# Patient Record
Sex: Male | Born: 1965 | Race: White | Hispanic: No | Marital: Married | Smoking: Never smoker
Health system: Southern US, Community
[De-identification: ages and names within clinical notes are randomized; demographics above are authoritative.]

## PROBLEM LIST (undated history)

## (undated) DIAGNOSIS — L4050 Arthropathic psoriasis, unspecified: Secondary | ICD-10-CM

## (undated) HISTORY — DX: Arthropathic psoriasis, unspecified: L40.50

## (undated) HISTORY — PX: KNEE ARTHROPLASTY: SHX992

---

## 2002-09-13 ENCOUNTER — Emergency Department (HOSPITAL_COMMUNITY): Admission: EM | Admit: 2002-09-13 | Discharge: 2002-09-13 | Payer: Self-pay | Admitting: Emergency Medicine

## 2010-07-06 ENCOUNTER — Emergency Department (HOSPITAL_COMMUNITY): Admission: EM | Admit: 2010-07-06 | Discharge: 2010-03-06 | Payer: Self-pay | Admitting: Emergency Medicine

## 2014-01-17 ENCOUNTER — Other Ambulatory Visit: Payer: Self-pay | Admitting: Orthopedic Surgery

## 2014-01-17 DIAGNOSIS — M25561 Pain in right knee: Secondary | ICD-10-CM

## 2014-01-19 ENCOUNTER — Ambulatory Visit
Admission: RE | Admit: 2014-01-19 | Discharge: 2014-01-19 | Disposition: A | Discharge status: Home / Self Care | Payer: BC Managed Care – PPO | Source: Ambulatory Visit | Attending: Orthopedic Surgery | Admitting: Orthopedic Surgery

## 2014-01-19 DIAGNOSIS — M25561 Pain in right knee: Secondary | ICD-10-CM

## 2015-11-08 DIAGNOSIS — M19041 Primary osteoarthritis, right hand: Secondary | ICD-10-CM | POA: Diagnosis not present

## 2015-11-08 DIAGNOSIS — Z09 Encounter for follow-up examination after completed treatment for conditions other than malignant neoplasm: Secondary | ICD-10-CM | POA: Diagnosis not present

## 2015-11-08 DIAGNOSIS — M79671 Pain in right foot: Secondary | ICD-10-CM | POA: Diagnosis not present

## 2015-11-08 DIAGNOSIS — L4050 Arthropathic psoriasis, unspecified: Secondary | ICD-10-CM | POA: Diagnosis not present

## 2015-11-21 DIAGNOSIS — L4052 Psoriatic arthritis mutilans: Secondary | ICD-10-CM | POA: Diagnosis not present

## 2015-11-28 DIAGNOSIS — Z79899 Other long term (current) drug therapy: Secondary | ICD-10-CM | POA: Diagnosis not present

## 2015-12-19 DIAGNOSIS — M79641 Pain in right hand: Secondary | ICD-10-CM | POA: Diagnosis not present

## 2015-12-19 DIAGNOSIS — Z09 Encounter for follow-up examination after completed treatment for conditions other than malignant neoplasm: Secondary | ICD-10-CM | POA: Diagnosis not present

## 2015-12-19 DIAGNOSIS — M19271 Secondary osteoarthritis, right ankle and foot: Secondary | ICD-10-CM | POA: Diagnosis not present

## 2015-12-19 DIAGNOSIS — L408 Other psoriasis: Secondary | ICD-10-CM | POA: Diagnosis not present

## 2016-01-18 DIAGNOSIS — L723 Sebaceous cyst: Secondary | ICD-10-CM | POA: Diagnosis not present

## 2016-01-18 DIAGNOSIS — D2272 Melanocytic nevi of left lower limb, including hip: Secondary | ICD-10-CM | POA: Diagnosis not present

## 2016-01-18 DIAGNOSIS — D2271 Melanocytic nevi of right lower limb, including hip: Secondary | ICD-10-CM | POA: Diagnosis not present

## 2016-01-18 DIAGNOSIS — B353 Tinea pedis: Secondary | ICD-10-CM | POA: Diagnosis not present

## 2016-01-18 DIAGNOSIS — Z86018 Personal history of other benign neoplasm: Secondary | ICD-10-CM | POA: Diagnosis not present

## 2016-01-18 DIAGNOSIS — D224 Melanocytic nevi of scalp and neck: Secondary | ICD-10-CM | POA: Diagnosis not present

## 2016-01-18 DIAGNOSIS — D225 Melanocytic nevi of trunk: Secondary | ICD-10-CM | POA: Diagnosis not present

## 2016-01-18 DIAGNOSIS — L739 Follicular disorder, unspecified: Secondary | ICD-10-CM | POA: Diagnosis not present

## 2016-01-18 DIAGNOSIS — D485 Neoplasm of uncertain behavior of skin: Secondary | ICD-10-CM | POA: Diagnosis not present

## 2016-03-06 DIAGNOSIS — E559 Vitamin D deficiency, unspecified: Secondary | ICD-10-CM | POA: Diagnosis not present

## 2016-03-06 DIAGNOSIS — L4050 Arthropathic psoriasis, unspecified: Secondary | ICD-10-CM | POA: Diagnosis not present

## 2016-03-06 DIAGNOSIS — L409 Psoriasis, unspecified: Secondary | ICD-10-CM | POA: Diagnosis not present

## 2016-03-06 DIAGNOSIS — R5383 Other fatigue: Secondary | ICD-10-CM | POA: Diagnosis not present

## 2016-03-19 DIAGNOSIS — Z79899 Other long term (current) drug therapy: Secondary | ICD-10-CM | POA: Diagnosis not present

## 2016-03-27 DIAGNOSIS — Z09 Encounter for follow-up examination after completed treatment for conditions other than malignant neoplasm: Secondary | ICD-10-CM | POA: Diagnosis not present

## 2016-03-27 DIAGNOSIS — L408 Other psoriasis: Secondary | ICD-10-CM | POA: Diagnosis not present

## 2016-04-10 DIAGNOSIS — D485 Neoplasm of uncertain behavior of skin: Secondary | ICD-10-CM | POA: Diagnosis not present

## 2016-07-19 DIAGNOSIS — D2271 Melanocytic nevi of right lower limb, including hip: Secondary | ICD-10-CM | POA: Diagnosis not present

## 2016-07-19 DIAGNOSIS — D485 Neoplasm of uncertain behavior of skin: Secondary | ICD-10-CM | POA: Diagnosis not present

## 2016-07-19 DIAGNOSIS — D2272 Melanocytic nevi of left lower limb, including hip: Secondary | ICD-10-CM | POA: Diagnosis not present

## 2016-07-19 DIAGNOSIS — D2221 Melanocytic nevi of right ear and external auricular canal: Secondary | ICD-10-CM | POA: Diagnosis not present

## 2016-07-19 DIAGNOSIS — D225 Melanocytic nevi of trunk: Secondary | ICD-10-CM | POA: Diagnosis not present

## 2016-07-26 ENCOUNTER — Other Ambulatory Visit: Payer: Self-pay | Admitting: *Deleted

## 2016-07-26 NOTE — Telephone Encounter (Signed)
Refill request received via fax for Cosentyx.  Last Visit: 03/27/16 Next Visit due January 2018. Message sent to the front to schedule patient.  Labs: 03/19/16 WNL TB Gold: 06/29/15 Neg  Left message for patient to contact the office. Patient is due for labs.

## 2016-07-27 ENCOUNTER — Telehealth: Payer: Self-pay | Admitting: Rheumatology

## 2016-07-27 NOTE — Telephone Encounter (Signed)
-----   Message from Carole Binning, LPN sent at [phone]  4:34 PM EST ----- Regarding: Please schedule patient for follow up visit Please schedule patient for follow up visit. Patient due January 2018. Thanks!

## 2016-07-27 NOTE — Telephone Encounter (Signed)
LMOM for patient to call back to schedule appointment.  

## 2016-08-03 ENCOUNTER — Other Ambulatory Visit: Payer: Self-pay | Admitting: *Deleted

## 2016-08-03 DIAGNOSIS — Z79899 Other long term (current) drug therapy: Secondary | ICD-10-CM

## 2016-08-03 MED ORDER — METHOTREXATE 2.5 MG PO TABS: 10.0000 mg | ORAL_TABLET | ORAL | 0 refills | Status: DC

## 2016-08-03 MED ORDER — SECUKINUMAB 150 MG/ML ~~LOC~~ SOAJ: 300.0000 mg | SUBCUTANEOUS | 0 refills | Status: DC

## 2016-08-03 NOTE — Addendum Note (Signed)
Addended by: Carole Binning on: 08/03/2016 03:45 PM   Modules accepted: Orders

## 2016-08-03 NOTE — Telephone Encounter (Signed)
Patient is going to update labs on 08/06/16.  Patient states he also needs a refill on MTX.  Okay to refill Cosentyx and MTX for 30 days?

## 2016-08-06 ENCOUNTER — Other Ambulatory Visit: Payer: Self-pay | Admitting: *Deleted

## 2016-08-06 DIAGNOSIS — Z79899 Other long term (current) drug therapy: Secondary | ICD-10-CM

## 2016-08-07 ENCOUNTER — Other Ambulatory Visit: Payer: Self-pay | Admitting: *Deleted

## 2016-08-07 DIAGNOSIS — Z79899 Other long term (current) drug therapy: Secondary | ICD-10-CM

## 2016-08-08 LAB — COMPLETE METABOLIC PANEL WITH GFR
ALT: 22 U/L (ref 9–46)
AST: 19 U/L (ref 10–35)
Albumin: 4.5 g/dL (ref 3.6–5.1)
Alkaline Phosphatase: 48 U/L (ref 40–115)
BUN: 18 mg/dL (ref 7–25)
CO2: 27 mmol/L (ref 20–31)
Calcium: 9.8 mg/dL (ref 8.6–10.3)
Chloride: 103 mmol/L (ref 98–110)
Creat: 1.14 mg/dL (ref 0.70–1.33)
GFR, EST NON AFRICAN AMERICAN: 75 mL/min (ref >=60)
GFR, Est African American: 86 mL/min (ref >=60)
GLUCOSE: 95 mg/dL (ref 65–99)
POTASSIUM: 4.3 mmol/L (ref 3.5–5.3)
SODIUM: 141 mmol/L (ref 135–146)
TOTAL PROTEIN: 7.6 g/dL (ref 6.1–8.1)
Total Bilirubin: 0.8 mg/dL (ref 0.2–1.2)

## 2016-08-08 LAB — CBC WITH DIFFERENTIAL/PLATELET
BASOS ABS: 0 {cells}/uL (ref 0–200)
Basophils Relative: 0 %
EOS PCT: 1 %
Eosinophils Absolute: 45 cells/uL (ref 15–500)
HCT: 46.3 % (ref 38.5–50.0)
Hemoglobin: 15.3 g/dL (ref 13.2–17.1)
Lymphocytes Relative: 38 %
Lymphs Abs: 1710 cells/uL (ref 850–3900)
MCH: 28.7 pg (ref 27.0–33.0)
MCHC: 33 g/dL (ref 32.0–36.0)
MCV: 86.7 fL (ref 80.0–100.0)
MONOS PCT: 8 %
MPV: 9.6 fL (ref 7.5–12.5)
Monocytes Absolute: 360 cells/uL (ref 200–950)
NEUTROS ABS: 2385 {cells}/uL (ref 1500–7800)
Neutrophils Relative %: 53 %
PLATELETS: 282 10*3/uL (ref 140–400)
RBC: 5.34 MIL/uL (ref 4.20–5.80)
RDW: 13.5 % (ref 11.0–15.0)
WBC: 4.5 10*3/uL (ref 3.8–10.8)

## 2016-08-08 NOTE — Progress Notes (Signed)
Labs normal.

## 2016-08-10 DIAGNOSIS — J101 Influenza due to other identified influenza virus with other respiratory manifestations: Secondary | ICD-10-CM | POA: Diagnosis not present

## 2016-08-27 ENCOUNTER — Other Ambulatory Visit: Payer: Self-pay | Admitting: Rheumatology

## 2016-08-28 ENCOUNTER — Other Ambulatory Visit: Payer: Self-pay | Admitting: Rheumatology

## 2016-08-28 ENCOUNTER — Other Ambulatory Visit: Payer: Self-pay | Admitting: Radiology

## 2016-08-28 MED ORDER — FOLIC ACID 1 MG PO TABS: 2.0000 mg | ORAL_TABLET | Freq: Every day | ORAL | 4 refills | Status: DC

## 2016-08-28 MED ORDER — METHOTREXATE 2.5 MG PO TABS: 10.0000 mg | ORAL_TABLET | ORAL | 0 refills | Status: DC

## 2016-08-28 NOTE — Telephone Encounter (Signed)
I have called patient/ labs WNL 08/07/16 He needs a follow up appt. I have sent a message for appt to be made. He states he did just come in, but I do not see an appt in his chart, he did have labs.   Ok to refill per Dr Estanislado Pandy  One month supply

## 2016-08-28 NOTE — Telephone Encounter (Signed)
Thank you, I did this already this am.

## 2016-08-28 NOTE — Telephone Encounter (Signed)
Patient is requesting refill of folic acid to be sent to [facility] in [place]. He has scheduled appointment on 10/01/16.

## 2016-08-30 ENCOUNTER — Other Ambulatory Visit: Payer: Self-pay | Admitting: *Deleted

## 2016-08-30 MED ORDER — SHARPS CONTAINER MISC: 1.0000 | 0 refills | Status: DC | PRN

## 2016-08-30 NOTE — Telephone Encounter (Signed)
Last Visit: 03/27/16 Next Visit: 10/01/16  Okay to refill [employer]?

## 2016-09-05 ENCOUNTER — Other Ambulatory Visit: Payer: Self-pay | Admitting: Rheumatology

## 2016-09-06 ENCOUNTER — Other Ambulatory Visit: Payer: Self-pay | Admitting: *Deleted

## 2016-09-06 DIAGNOSIS — Z9225 Personal history of immunosupression therapy: Secondary | ICD-10-CM

## 2016-09-06 NOTE — Telephone Encounter (Signed)
Patient coming in Monday to update TB Gold

## 2016-09-06 NOTE — Telephone Encounter (Signed)
Last Visit: 03/27/16 Next Visit: 10/01/16 Labs: 08/07/16 WNL TB Gold: 05/2015 Neg   Okay to refill Cosentyx?

## 2016-09-06 NOTE — Telephone Encounter (Signed)
Is pt due for repeat tb gold since last one you wrote down is nov 2016?

## 2016-09-11 ENCOUNTER — Other Ambulatory Visit: Payer: Self-pay | Admitting: *Deleted

## 2016-09-11 DIAGNOSIS — Z9225 Personal history of immunosupression therapy: Secondary | ICD-10-CM | POA: Diagnosis not present

## 2016-09-13 LAB — QUANTIFERON TB GOLD ASSAY (BLOOD)
Interferon Gamma Release Assay: NEGATIVE
Mitogen-Nil: >10 IU/mL
Quantiferon Nil Value: 0.03 IU/mL
Quantiferon Tb Ag Minus Nil Value: 0.05 IU/mL

## 2016-09-13 NOTE — Progress Notes (Signed)
Please tell patient his TB gold is negative

## 2016-09-18 DIAGNOSIS — L988 Other specified disorders of the skin and subcutaneous tissue: Secondary | ICD-10-CM | POA: Diagnosis not present

## 2016-09-18 DIAGNOSIS — D485 Neoplasm of uncertain behavior of skin: Secondary | ICD-10-CM | POA: Diagnosis not present

## 2016-09-26 ENCOUNTER — Telehealth: Payer: Self-pay | Admitting: Rheumatology

## 2016-09-26 NOTE — Telephone Encounter (Signed)
Kyle from [facility] called about cosentyx for patient. Please call them back at [phone] opt [phone]

## 2016-09-26 NOTE — Progress Notes (Signed)
Office Visit Note  Patient: Mitchell Morris             Date of Birth: [birth year]           MRN: [id number]             PCP: Tawanna Solo, MD Referring: No ref. provider found Visit Date: 10/01/2016 Occupation: '@GUAROCC' @    Subjective:  Follow-up Psoriasis and psoriatic arthritis and high risk prescription  History of Present Illness: Mitchell Morris is a 51 y.o. male  Last seen 03/27/2016  Patient states he is doing really well with all of his joints as it relates to psoriatic arthritis. He is using medications as prescribed. He is on Cosentyx every 4 weeks Methotrexate 4 pills per week Folic acid 1 mg daily.  He is also complaining with his labs. CBC with differential and CMP with GFR normal as of June 2018 TB goal is negative as of Feb 2018.  He has a history of pre-melanoma lesions. He sees Dr. Delman Cheadle, dermatologist, for this. He had a lesion from the right scalp removed approximately 04/10/2017 and 1 from his left back about 2 weeks ago. Pathology report is pending. For the left back lesion.  His main complaint today is he's been having left back pain that got worse since Saturday. He has a ongoing problem with his back which is causing pain to radiate from the back down to his left inguinal area. It has been numb and tingling off and on for few months. Unfortunately, he started having left scrotal numbness and pain. He rates this pain and discomfort as 5-6 on a scale of 0-10. In the past he rated his pain as 2-3 on a scale of 0-10. He went to an urgent care evaluation and treatment. They diagnosed him with epididymitis. They prescribed him antibiotic but patient has not had it filled yet. He was thinking that they would give him some meloxicam but that has not been called them yet. He did take Advil 200 mg, 4 pills every 8 hours for pain control. I advised him that he can continue taking this medication at that frequency for sure could've time until he  can see his PCP. No fever, no injuries except listed couple of heavy boxes at work which may have exacerbated his pain No urinary symptoms  Patient does report that he has a business trip to go to in [facility]. It requires him to sit on the plane but he's afraid that the current problem will not allow him to sit comfortably. He states "I was very uncomfortable sitting in the chair in the waiting room.   Activities of Daily Living:  Patient reports morning stiffness for 30 minutes.   Patient Reports nocturnal pain.  Difficulty dressing/grooming: Reports Difficulty climbing stairs: Reports Difficulty getting out of chair: Reports Difficulty using hands for taps, buttons, cutlery, and/or writing: Reports   Review of Systems  Constitutional: Negative for fatigue.  HENT: Negative for mouth sores and mouth dryness.   Eyes: Negative for dryness.  Respiratory: Negative for shortness of breath.   Gastrointestinal: Negative for constipation and diarrhea.  Musculoskeletal: Negative for myalgias and myalgias.  Skin: Negative for sensitivity to sunlight.  Neurological: Negative for memory loss.  Psychiatric/Behavioral: Negative for sleep disturbance.    PMFS History:  Patient Active Problem List   Diagnosis Date Noted  . Psoriatic arthritis ([place]) 09/27/2016  . Psoriasis 09/27/2016  . High risk medication use 09/27/2016  . History of osteoarthritis  09/27/2016    No past medical history on file.  No family history on file. No past surgical history on file. Social History   Social History Narrative  . No narrative on file     Objective: Vital Signs: BP (!) 157/94   Pulse 77   Resp 12   Ht 6' (1.829 m)   Wt 200 lb (90.7 kg)   BMI 27.12 kg/m    Physical Exam  Constitutional: He is oriented to person, place, and time. He appears well-developed and well-nourished.  HENT:  Head: Normocephalic and atraumatic.  Eyes: Conjunctivae and EOM are normal. Pupils are equal,  round, and reactive to light.  Neck: Normal range of motion. Neck supple.  Cardiovascular: Normal rate, regular rhythm and normal heart sounds.  Exam reveals no gallop and no friction rub.   No murmur heard. Pulmonary/Chest: Effort normal and breath sounds normal. No respiratory distress. He has no wheezes. He has no rales. He exhibits no tenderness.  Abdominal: Soft. He exhibits no distension and no mass. There is no tenderness. There is no guarding.  Musculoskeletal: Normal range of motion.  Lymphadenopathy:    He has no cervical adenopathy.  Neurological: He is alert and oriented to person, place, and time. He exhibits normal muscle tone. Coordination normal.  Skin: Skin is warm and dry. Capillary refill takes less than 2 seconds. No rash noted.  Psychiatric: He has a normal mood and affect. His behavior is normal. Judgment and thought content normal.  Nursing note and vitals reviewed.    Musculoskeletal Exam:  Full range of motion of all joints Grip strength is equal and strong bilaterally For myalgia tender points are absent  CDAI Exam: No CDAI exam completed.  No synovitis on examination  Investigation: Findings:  09/13/2016-TB Gold Negative  Right scalp lesion removed.  According to the patient it is a pre-melanoma lesion.  Dr. Delman Cheadle addressed it, who is the dermatologist.  Surgical treatment is scheduled for September 12 for more removal of the same lesion.    Orders Only on 09/11/2016  Component Date Value Ref Range Status  . Interferon Gamma Release Assay 09/11/2016 NEGATIVE  NEGATIVE Final  . Quantiferon Nil Value 09/11/2016 0.03  IU/mL Final  . Mitogen-Nil 09/11/2016 >10.00  IU/mL Final  . Quantiferon Tb Ag Minus Nil Value 09/11/2016 0.05  IU/mL Final   Comment:   The Nil tube value is used to determine if the patient has a preexisting immune response which could cause a false-positive reading on the test. In order for a test to be valid, the Nil tube must have  a value of less than or equal to 8.0 IU/mL.   The mitogen control tube is used to assure the patient has a healthy immune status and also serves as a control for correct blood handling and incubation. It is used to detect false-negative readings. The mitogen tube must have a gamma interferon value of greater than or equal to 0.5 IU/mL higher than the value of the Nil tube.   The TB antigen tube is coated with the M. tuberculosis specific antigens. For a test to be considered positive, the TB antigen tube value minus the Nil tube value must be greater than or equal to 0.35 IU/mL.   For additional information, please refer to http://education.questdiagnostics.com/faq/QFT (This link is being provided for informational/educational purposes only.)   Orders Only on 08/07/2016  Component Date Value Ref Range Status  . WBC 08/07/2016 4.5  3.8 - 10.8 K/uL Final  .  RBC 08/07/2016 5.34  4.20 - 5.80 MIL/uL Final  . Hemoglobin 08/07/2016 15.3  13.2 - 17.1 g/dL Final  . HCT 08/07/2016 46.3  38.5 - 50.0 % Final  . MCV 08/07/2016 86.7  80.0 - 100.0 fL Final  . MCH 08/07/2016 28.7  27.0 - 33.0 pg Final  . MCHC 08/07/2016 33.0  32.0 - 36.0 g/dL Final  . RDW 08/07/2016 13.5  11.0 - 15.0 % Final  . Platelets 08/07/2016 282  140 - 400 K/uL Final  . MPV 08/07/2016 9.6  7.5 - 12.5 fL Final  . Neutro Abs 08/07/2016 2385  1,500 - 7,800 cells/uL Final  . Lymphs Abs 08/07/2016 1710  850 - 3,900 cells/uL Final  . Monocytes Absolute 08/07/2016 360  200 - 950 cells/uL Final  . Eosinophils Absolute 08/07/2016 45  15 - 500 cells/uL Final  . Basophils Absolute 08/07/2016 0  0 - 200 cells/uL Final  . Neutrophils Relative % 08/07/2016 53  % Final  . Lymphocytes Relative 08/07/2016 38  % Final  . Monocytes Relative 08/07/2016 8  % Final  . Eosinophils Relative 08/07/2016 1  % Final  . Basophils Relative 08/07/2016 0  % Final  . Smear Review 08/07/2016 Criteria for review not met   Final  . Sodium 08/07/2016 141   135 - 146 mmol/L Final  . Potassium 08/07/2016 4.3  3.5 - 5.3 mmol/L Final  . Chloride 08/07/2016 103  98 - 110 mmol/L Final  . CO2 08/07/2016 27  20 - 31 mmol/L Final  . Glucose, Bld 08/07/2016 95  65 - 99 mg/dL Final  . BUN 08/07/2016 18  7 - 25 mg/dL Final  . Creat 08/07/2016 1.14  0.70 - 1.33 mg/dL Final   Comment:   For patients > or = 51 years of age: The upper reference limit for Creatinine is approximately 13% higher for people identified as African-American.     . Total Bilirubin 08/07/2016 0.8  0.2 - 1.2 mg/dL Final  . Alkaline Phosphatase 08/07/2016 48  40 - 115 U/L Final  . AST 08/07/2016 19  10 - 35 U/L Final  . ALT 08/07/2016 22  9 - 46 U/L Final  . Total Protein 08/07/2016 7.6  6.1 - 8.1 g/dL Final  . Albumin 08/07/2016 4.5  3.6 - 5.1 g/dL Final  . Calcium 08/07/2016 9.8  8.6 - 10.3 mg/dL Final  . GFR, Est African American 08/07/2016 86  >=60 mL/min Final  . GFR, Est Non African American 08/07/2016 75  >=60 mL/min Final      Imaging: No results found.  Speciality Comments: No specialty comments available.    Procedures:  No procedures performed Allergies: Patient has no known allergies.   Assessment / Plan:     Visit Diagnoses: Psoriatic arthritis ([place])  Psoriasis  High risk medication use - MTX-2.5 MG tablet (16 tablet 0refill) 4 per weekFolic Acid- 1 MG tablet (180 tablet 4refill) 1 per dayCosentyx-300 dose 151m/mL SOAJ (3 pen 0refill)eve  Acute left-sided low back pain with left-sided sciatica  History of osteoarthritis -    Plan: #1: Psoriatic arthritis. Doing well. No complaint. No joint pain stiffness and swelling.  #2: Psoriasis. No flare. Doing well.  #3: High risk prescription. On Cosentyx every 4 weeks On methotrexate 4 per week On folic acid 1 mg daily.  TB goal is negative as of 09/11/2016 CBC with differential CMP with GFR normal as a January 2018  #4: Pre-melanoma lesion. Patient had a new lesion on the left back  that was  evaluated and treated by Dr. Girtha Rm. He states that 2 weeks ago, the left back lesion was removed by Dr. Girtha Rm. This is in addition to the pre-melanoma lesion that Dr. Girtha Rm removed September 12 to the right scalp.  #5: Left back pain that's worse since this Saturday. The pain radiates from the back to the left inguinal area to the left scrotum. He had a history of left inguinal numbness and tingling for the last few months off and on. He saw urgent care on Saturday but they diagnosed with epididymitis. They gave him an antibiotic but patient has not had it filled yet. Patient will see [facility] at urgent care at noon as a walk-in patient.  #6: Labs are up-to-date and normal. CBC with differential CMP with GFR every 3 months starting April 2018 TB gold due annually starting February 2019  #7: No med refills needed at this time according to patient. He just had all of his medicines filled.  #8: Return to clinic in 5 months Orders: No orders of the defined types were placed in this encounter.  No orders of the defined types were placed in this encounter.   Face-to-face time spent with patient was 30 minutes. 50% of time was spent in counseling and coordination of care.  Follow-Up Instructions: Return in about 5 months (around 03/03/2017) for PsA, Ps, Cosentyx q 4 wks, MTX 4/wk, Folic 66m qd // pre-melanoma  // .   NEliezer Lofts PA-C  Note - This record has been created using DEditor, commissioning  Chart creation errors have been sought, but may not always  have been located. Such creation errors do not reflect on  the standard of medical care.

## 2016-09-26 NOTE — Telephone Encounter (Signed)
Advised prescription sent in on 09/06/16 should be have been for a 90 day supply. Mitchell Morris states he would fix the prescription and get the medication to the patient.

## 2016-09-27 ENCOUNTER — Other Ambulatory Visit: Payer: Self-pay | Admitting: Rheumatology

## 2016-09-27 DIAGNOSIS — L4050 Arthropathic psoriasis, unspecified: Secondary | ICD-10-CM | POA: Insufficient documentation

## 2016-09-27 DIAGNOSIS — Z79899 Other long term (current) drug therapy: Secondary | ICD-10-CM | POA: Insufficient documentation

## 2016-09-27 DIAGNOSIS — L409 Psoriasis, unspecified: Secondary | ICD-10-CM | POA: Insufficient documentation

## 2016-09-27 NOTE — Telephone Encounter (Signed)
ok 

## 2016-09-27 NOTE — Telephone Encounter (Signed)
Last Visit: 03/27/16 Next Visit: 10/01/16 Labs: 08/07/16 WNL  Okay to refill MTX?

## 2016-09-29 DIAGNOSIS — N50819 Testicular pain, unspecified: Secondary | ICD-10-CM | POA: Diagnosis not present

## 2016-09-29 DIAGNOSIS — N451 Epididymitis: Secondary | ICD-10-CM | POA: Diagnosis not present

## 2016-09-29 DIAGNOSIS — Z79899 Other long term (current) drug therapy: Secondary | ICD-10-CM | POA: Diagnosis not present

## 2016-10-01 ENCOUNTER — Ambulatory Visit (INDEPENDENT_AMBULATORY_CARE_PROVIDER_SITE_OTHER): Payer: BLUE CROSS/BLUE SHIELD | Admitting: Rheumatology

## 2016-10-01 ENCOUNTER — Encounter: Payer: Self-pay | Admitting: Rheumatology

## 2016-10-01 VITALS — BP 157/94 | HR 77 | Resp 12 | Ht 72.0 in | Wt 200.0 lb

## 2016-10-01 DIAGNOSIS — Z79899 Other long term (current) drug therapy: Secondary | ICD-10-CM

## 2016-10-01 DIAGNOSIS — L4050 Arthropathic psoriasis, unspecified: Secondary | ICD-10-CM

## 2016-10-01 DIAGNOSIS — Z8739 Personal history of other diseases of the musculoskeletal system and connective tissue: Secondary | ICD-10-CM

## 2016-10-01 DIAGNOSIS — R1030 Lower abdominal pain, unspecified: Secondary | ICD-10-CM | POA: Diagnosis not present

## 2016-10-01 DIAGNOSIS — M5442 Lumbago with sciatica, left side: Secondary | ICD-10-CM | POA: Diagnosis not present

## 2016-10-01 DIAGNOSIS — N50819 Testicular pain, unspecified: Secondary | ICD-10-CM | POA: Diagnosis not present

## 2016-10-01 DIAGNOSIS — M5489 Other dorsalgia: Secondary | ICD-10-CM | POA: Diagnosis not present

## 2016-10-01 DIAGNOSIS — L409 Psoriasis, unspecified: Secondary | ICD-10-CM

## 2016-10-03 DIAGNOSIS — M9903 Segmental and somatic dysfunction of lumbar region: Secondary | ICD-10-CM | POA: Diagnosis not present

## 2016-10-31 DIAGNOSIS — M5416 Radiculopathy, lumbar region: Secondary | ICD-10-CM | POA: Diagnosis not present

## 2016-11-02 ENCOUNTER — Other Ambulatory Visit: Payer: Self-pay | Admitting: Family Medicine

## 2016-11-02 DIAGNOSIS — M5416 Radiculopathy, lumbar region: Secondary | ICD-10-CM

## 2016-11-11 ENCOUNTER — Ambulatory Visit
Admission: RE | Admit: 2016-11-11 | Discharge: 2016-11-11 | Disposition: A | Discharge status: Home / Self Care | Payer: BLUE CROSS/BLUE SHIELD | Source: Ambulatory Visit | Attending: Family Medicine | Admitting: Family Medicine

## 2016-11-11 DIAGNOSIS — M5416 Radiculopathy, lumbar region: Secondary | ICD-10-CM

## 2016-11-11 DIAGNOSIS — M5126 Other intervertebral disc displacement, lumbar region: Secondary | ICD-10-CM | POA: Diagnosis not present

## 2016-11-21 ENCOUNTER — Telehealth: Payer: Self-pay | Admitting: Radiology

## 2016-11-21 DIAGNOSIS — Z79899 Other long term (current) drug therapy: Secondary | ICD-10-CM

## 2016-11-21 NOTE — Telephone Encounter (Signed)
Refill request received via fax for   Cosentyx [facility]

## 2016-11-21 NOTE — Telephone Encounter (Signed)
Labs due sent message to patient

## 2016-11-22 NOTE — Addendum Note (Signed)
Addended byCandice Camp on: 11/22/2016 02:01 PM   Modules accepted: Orders

## 2016-11-22 NOTE — Telephone Encounter (Signed)
I have called patient to see what his lab plan is. He states he can come in for these on Monday

## 2016-11-29 NOTE — Telephone Encounter (Signed)
Attempted to contact the patient and left message for patient to call the office.  

## 2016-12-03 ENCOUNTER — Other Ambulatory Visit: Payer: Self-pay

## 2016-12-03 ENCOUNTER — Other Ambulatory Visit: Payer: Self-pay | Admitting: Rheumatology

## 2016-12-03 DIAGNOSIS — M545 Low back pain: Secondary | ICD-10-CM | POA: Diagnosis not present

## 2016-12-03 DIAGNOSIS — M5116 Intervertebral disc disorders with radiculopathy, lumbar region: Secondary | ICD-10-CM | POA: Diagnosis not present

## 2016-12-03 DIAGNOSIS — Z79899 Other long term (current) drug therapy: Secondary | ICD-10-CM

## 2016-12-03 LAB — COMPLETE METABOLIC PANEL WITH GFR
ALBUMIN: 4.5 g/dL (ref 3.6–5.1)
ALK PHOS: 59 U/L (ref 40–115)
ALT: 24 U/L (ref 9–46)
AST: 22 U/L (ref 10–35)
BUN: 13 mg/dL (ref 7–25)
CALCIUM: 9.9 mg/dL (ref 8.6–10.3)
CO2: 27 mmol/L (ref 20–31)
Chloride: 102 mmol/L (ref 98–110)
Creat: 1.02 mg/dL (ref 0.70–1.33)
GFR, EST NON AFRICAN AMERICAN: 85 mL/min (ref >=60)
Glucose, Bld: 86 mg/dL (ref 65–99)
POTASSIUM: 3.9 mmol/L (ref 3.5–5.3)
Sodium: 141 mmol/L (ref 135–146)
Total Bilirubin: 0.6 mg/dL (ref 0.2–1.2)
Total Protein: 7.5 g/dL (ref 6.1–8.1)

## 2016-12-03 LAB — CBC WITH DIFFERENTIAL/PLATELET
BASOS ABS: 0 {cells}/uL (ref 0–200)
Basophils Relative: 0 %
Eosinophils Absolute: 64 cells/uL (ref 15–500)
Eosinophils Relative: 1 %
HCT: 44.3 % (ref 38.5–50.0)
HEMOGLOBIN: 15.2 g/dL (ref 13.2–17.1)
LYMPHS ABS: 1792 {cells}/uL (ref 850–3900)
Lymphocytes Relative: 28 %
MCH: 28.8 pg (ref 27.0–33.0)
MCHC: 34.3 g/dL (ref 32.0–36.0)
MCV: 83.9 fL (ref 80.0–100.0)
MONO ABS: 576 {cells}/uL (ref 200–950)
MPV: 9.6 fL (ref 7.5–12.5)
Monocytes Relative: 9 %
NEUTROS PCT: 62 %
Neutro Abs: 3968 cells/uL (ref 1500–7800)
Platelets: 286 10*3/uL (ref 140–400)
RBC: 5.28 MIL/uL (ref 4.20–5.80)
RDW: 13.9 % (ref 11.0–15.0)
WBC: 6.4 10*3/uL (ref 3.8–10.8)

## 2016-12-03 NOTE — Telephone Encounter (Signed)
Pharmacy calling requesting a rf Cosentyx. [phone]

## 2016-12-04 MED ORDER — SECUKINUMAB 150 MG/ML ~~LOC~~ SOAJ: 300.0000 mg | SUBCUTANEOUS | 0 refills | Status: DC

## 2016-12-04 NOTE — Telephone Encounter (Signed)
Last Visit: 10/01/16 Next Visit: 03/04/17 Labs: 12/03/16 WNL TB Gold:09/11/16 Neg   Okay to refill Cosentyx?

## 2016-12-04 NOTE — Telephone Encounter (Signed)
Labs were completed on 12/03/16. Requested for medication refill forwarded to Mr. Carlyon Shadow.

## 2016-12-04 NOTE — Progress Notes (Signed)
WNL

## 2016-12-31 DIAGNOSIS — J029 Acute pharyngitis, unspecified: Secondary | ICD-10-CM | POA: Diagnosis not present

## 2016-12-31 DIAGNOSIS — H6980 Other specified disorders of Eustachian tube, unspecified ear: Secondary | ICD-10-CM | POA: Diagnosis not present

## 2017-01-23 DIAGNOSIS — B353 Tinea pedis: Secondary | ICD-10-CM | POA: Diagnosis not present

## 2017-01-23 DIAGNOSIS — L570 Actinic keratosis: Secondary | ICD-10-CM | POA: Diagnosis not present

## 2017-01-23 DIAGNOSIS — L821 Other seborrheic keratosis: Secondary | ICD-10-CM | POA: Diagnosis not present

## 2017-01-23 DIAGNOSIS — D2271 Melanocytic nevi of right lower limb, including hip: Secondary | ICD-10-CM | POA: Diagnosis not present

## 2017-01-23 DIAGNOSIS — D2221 Melanocytic nevi of right ear and external auricular canal: Secondary | ICD-10-CM | POA: Diagnosis not present

## 2017-02-27 ENCOUNTER — Telehealth: Payer: Self-pay | Admitting: Rheumatology

## 2017-02-27 MED ORDER — SECUKINUMAB 150 MG/ML ~~LOC~~ SOAJ: 300.0000 mg | SUBCUTANEOUS | 0 refills | Status: DC

## 2017-02-27 NOTE — Telephone Encounter (Signed)
Last Visit: 10/01/16 Next Visit: 03/04/17 Labs: 12/03/16 WNL TB Gold:09/11/16 Neg   Okay to refill per Dr. Estanislado Pandy

## 2017-02-27 NOTE — Telephone Encounter (Signed)
[facility] calling for refill on Patients Cosentex. Fax# [phone]. [facility] states they have faxed 3 requests for this. Please advise.

## 2017-03-04 ENCOUNTER — Ambulatory Visit: Payer: BLUE CROSS/BLUE SHIELD | Admitting: Rheumatology

## 2017-03-12 DIAGNOSIS — M19041 Primary osteoarthritis, right hand: Secondary | ICD-10-CM | POA: Insufficient documentation

## 2017-03-12 DIAGNOSIS — M19042 Primary osteoarthritis, left hand: Secondary | ICD-10-CM | POA: Insufficient documentation

## 2017-03-12 DIAGNOSIS — M19071 Primary osteoarthritis, right ankle and foot: Secondary | ICD-10-CM | POA: Insufficient documentation

## 2017-03-12 DIAGNOSIS — Z789 Other specified health status: Secondary | ICD-10-CM | POA: Insufficient documentation

## 2017-03-12 DIAGNOSIS — Z7289 Other problems related to lifestyle: Secondary | ICD-10-CM | POA: Insufficient documentation

## 2017-03-12 DIAGNOSIS — M5136 Other intervertebral disc degeneration, lumbar region: Secondary | ICD-10-CM | POA: Insufficient documentation

## 2017-03-12 DIAGNOSIS — M19072 Primary osteoarthritis, left ankle and foot: Secondary | ICD-10-CM

## 2017-03-12 NOTE — Progress Notes (Signed)
Office Visit Note  Patient: Mitchell Morris             Date of Birth: 03-16-1966           MRN: [id number]             PCP: Kathyrn Lass, MD Referring: Kathyrn Lass, MD Visit Date: 03/19/2017 Occupation: @GUAROCC @    Subjective:  Medication Management   History of Present Illness: Mitchell Morris is a 51 y.o. male with history of psoriatic arthritis and psoriasis. He states he is doing quite well on the combination therapy currently. He missed a few doses of methotrexate couple of months ago and had a flare about a month ago. He describes he started having increased pain in his right shoulder and right elbow. He was having difficulty lifting objects and discomfort at night. He resumed methotrexate 4 tablets by mouth every week and the symptoms are gradually getting better. He still has some discomfort i right elbow. He denies any active psoriasis lesions.   Activities of Daily Living:  Patient reports morning stiffness for 0 minute.   Patient Denies nocturnal pain.  Difficulty dressing/grooming: Denies Difficulty climbing stairs: Denies Difficulty getting out of chair: Denies Difficulty using hands for taps, buttons, cutlery, and/or writing: Denies   Review of Systems  Constitutional: Positive for fatigue. Negative for night sweats and weakness ( ).  HENT: Negative for mouth sores, mouth dryness and nose dryness.   Eyes: Negative for redness and dryness.  Respiratory: Negative for shortness of breath and difficulty breathing.   Cardiovascular: Negative for chest pain, palpitations, hypertension, irregular heartbeat and swelling in legs/feet.  Gastrointestinal: Negative for constipation and diarrhea.  Endocrine: Negative for increased urination.  Musculoskeletal: Positive for arthralgias and joint pain. Negative for joint swelling, myalgias, muscle weakness, morning stiffness, muscle tenderness and myalgias.  Skin: Negative for color change, rash, hair loss,  nodules/bumps, skin tightness, ulcers and sensitivity to sunlight.  Allergic/Immunologic: Negative for susceptible to infections.  Neurological: Negative for dizziness, fainting, memory loss and night sweats.  Hematological: Negative for swollen glands.  Psychiatric/Behavioral: Negative for depressed mood and sleep disturbance. The patient is not nervous/anxious.     PMFS History:  Patient Active Problem List   Diagnosis Date Noted  . Primary osteoarthritis of both hands 03/12/2017  . Primary osteoarthritis of both feet 03/12/2017  . DDD (degenerative disc disease), lumbar 03/12/2017  . Alcohol use/ keep Methotrexate dose low  03/12/2017  . Psoriatic arthritis ([place]) 09/27/2016  . Psoriasis 09/27/2016  . High risk medication use 09/27/2016    History reviewed. No pertinent past medical history.  No family history on file. History reviewed. No pertinent surgical history. Social History   Social History Narrative  . No narrative on file     Objective: Vital Signs: BP 120/74   Pulse 74   Resp 14   Ht 6' (1.829 m)   Wt 205 lb (93 kg)   BMI 27.80 kg/m    Physical Exam  Constitutional: He is oriented to person, place, and time. He appears well-developed and well-nourished.  HENT:  Head: Normocephalic and atraumatic.  Eyes: Pupils are equal, round, and reactive to light. Conjunctivae and EOM are normal.  Neck: Normal range of motion. Neck supple.  Cardiovascular: Normal rate, regular rhythm and normal heart sounds.   Pulmonary/Chest: Effort normal and breath sounds normal.  Abdominal: Soft. Bowel sounds are normal.  Neurological: He is alert and oriented to person, place, and time.  Skin: Skin is  warm and dry. Capillary refill takes less than 2 seconds.  Psychiatric: He has a normal mood and affect. His behavior is normal.  Nursing note and vitals reviewed.    Musculoskeletal Exam: C-spine and thoracic lumbar spine good range of motion. Shoulder joints elbow joints wrist  joint MCPs PIPs DIPs with good range of motion with no synovitis. Hip joints knee joints ankles MTPs PIPs DIPs with good range of motion with no synovitis. He had mild tenderness over the right medial epicondyle area.  CDAI Exam: CDAI Homunculus Exam:   Joint Counts:  CDAI Tender Joint count: 0 CDAI Swollen Joint count: 0  Global Assessments:  Patient Global Assessment: 2 Provider Global Assessment: 2  CDAI Calculated Score: 4    Investigation: Findings:  09/13/2016 TB Gold Negative  CBC Latest Ref Rng & Units 12/03/2016 08/07/2016  WBC 3.8 - 10.8 K/uL 6.4 4.5  Hemoglobin 13.2 - 17.1 g/dL 15.2 15.3  Hematocrit 38.5 - 50.0 % 44.3 46.3  Platelets 140 - 400 K/uL 286 282   CMP Latest Ref Rng & Units 12/03/2016 08/07/2016  Glucose 65 - 99 mg/dL 86 95  BUN 7 - 25 mg/dL 13 18  Creatinine 0.70 - 1.33 mg/dL 1.02 1.14  Sodium 135 - 146 mmol/L 141 141  Potassium 3.5 - 5.3 mmol/L 3.9 4.3  Chloride 98 - 110 mmol/L 102 103  CO2 20 - 31 mmol/L 27 27  Calcium 8.6 - 10.3 mg/dL 9.9 9.8  Total Protein 6.1 - 8.1 g/dL 7.5 7.6  Total Bilirubin 0.2 - 1.2 mg/dL 0.6 0.8  Alkaline Phos 40 - 115 U/L 59 48  AST 10 - 35 U/L 22 19  ALT 9 - 46 U/L 24 22    Imaging: No results found.  Speciality Comments: No specialty comments available.    Procedures:  No procedures performed Allergies: Patient has no known allergies.   Assessment / Plan:     Visit Diagnoses: Psoriatic arthritis [facility]): He has no synovitis on examination today. He had recent flare when he came off the methotrexate. The symptoms are gradually getting better. Patient did question regarding switching to [facility] we had detailed discussion regarding that. As he is doing quite well on combination therapy currently I would prefer not to switch him to [place]. He has failed Humira and stelara in the past.  Psoriasis: He has no active lesions of psoriasis.  High risk medication use - Methotrexate 4 tablets by mouth every week, folic acid 1 mg  by mouth daily and Cosentyx 300 mg subcutaneous every month(Humira, stelara-failed) - Plan: CBC with Differential/Platelet, COMPLETE METABOLIC PANEL WITH GFR, Quantiferon tb gold assay (blood). We will continue to monitor his labs every 3 months.  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 CBC with Differential/Platelet  . COMPLETE METABOLIC PANEL WITH GFR  . Quantiferon tb gold assay (blood)   No orders of the defined types were placed in this encounter.    Follow-Up Instructions: Return in about 5 months (around 08/19/2017) for Psoriatic arthritis Ps DDD.   Bo Merino, MD  Note - This record has been created using Editor, commissioning.  Chart creation errors have been sought, but may not always  have been located. Such creation errors do not reflect on  the standard  of medical care.

## 2017-03-19 ENCOUNTER — Ambulatory Visit (INDEPENDENT_AMBULATORY_CARE_PROVIDER_SITE_OTHER): Payer: BLUE CROSS/BLUE SHIELD | Admitting: Rheumatology

## 2017-03-19 ENCOUNTER — Encounter: Payer: Self-pay | Admitting: Rheumatology

## 2017-03-19 VITALS — BP 120/74 | HR 74 | Resp 14 | Ht 72.0 in | Wt 205.0 lb

## 2017-03-19 DIAGNOSIS — L409 Psoriasis, unspecified: Secondary | ICD-10-CM | POA: Diagnosis not present

## 2017-03-19 DIAGNOSIS — Z79899 Other long term (current) drug therapy: Secondary | ICD-10-CM

## 2017-03-19 DIAGNOSIS — M19042 Primary osteoarthritis, left hand: Secondary | ICD-10-CM | POA: Diagnosis not present

## 2017-03-19 DIAGNOSIS — Z789 Other specified health status: Secondary | ICD-10-CM | POA: Diagnosis not present

## 2017-03-19 DIAGNOSIS — M19041 Primary osteoarthritis, right hand: Secondary | ICD-10-CM

## 2017-03-19 DIAGNOSIS — M19071 Primary osteoarthritis, right ankle and foot: Secondary | ICD-10-CM | POA: Diagnosis not present

## 2017-03-19 DIAGNOSIS — M5136 Other intervertebral disc degeneration, lumbar region: Secondary | ICD-10-CM

## 2017-03-19 DIAGNOSIS — L4050 Arthropathic psoriasis, unspecified: Secondary | ICD-10-CM | POA: Diagnosis not present

## 2017-03-19 DIAGNOSIS — M19072 Primary osteoarthritis, left ankle and foot: Secondary | ICD-10-CM | POA: Diagnosis not present

## 2017-03-19 DIAGNOSIS — Z7289 Other problems related to lifestyle: Secondary | ICD-10-CM

## 2017-03-19 LAB — CBC WITH DIFFERENTIAL/PLATELET
BASOS PCT: 1 %
Basophils Absolute: 41 cells/uL (ref 0–200)
Eosinophils Absolute: 41 cells/uL (ref 15–500)
Eosinophils Relative: 1 %
HCT: 44.8 % (ref 38.5–50.0)
Hemoglobin: 15.1 g/dL (ref 13.2–17.1)
LYMPHS PCT: 33 %
Lymphs Abs: 1353 cells/uL (ref 850–3900)
MCH: 28.7 pg (ref 27.0–33.0)
MCHC: 33.7 g/dL (ref 32.0–36.0)
MCV: 85.2 fL (ref 80.0–100.0)
MONOS PCT: 9 %
MPV: 9.9 fL (ref 7.5–12.5)
Monocytes Absolute: 369 cells/uL (ref 200–950)
Neutro Abs: 2296 cells/uL (ref 1500–7800)
Neutrophils Relative %: 56 %
PLATELETS: 256 10*3/uL (ref 140–400)
RBC: 5.26 MIL/uL (ref 4.20–5.80)
RDW: 13.5 % (ref 11.0–15.0)
WBC: 4.1 10*3/uL (ref 3.8–10.8)

## 2017-03-19 NOTE — Progress Notes (Signed)
Rheumatology Medication Review by a Pharmacist Does the patient feel that his/her medications are working for him/her?  Yes Has the patient been experiencing any side effects to the medications prescribed?  No Does the patient have any problems obtaining medications?  No  Issues to address at subsequent visits: None   Pharmacist comments:  Mitchell Morris is a pleasant 51 yo M who presents for follow up of psoriatic arthritis.  He is currently taking Cosentyx 300 mg every month, methotrexate 4 tablets weekly, and folic acid 2 mg daily.  He reports he forgot to take methotrexate for a couple of months.  He had no reason for stopping, but reports he just forgot.  He restarted the medication approximately four weeks ago.  Most recent standing labs were normal on 12/03/16.  Patient is due for labs today then every 3 months.  Most recent TB Gold negative on 09/11/16.  Patient will be due for TB Gold again in February 2019.  Patient denies any questions or concerns regarding his medications at this time.   Elisabeth Most, Pharm.D., BCPS, CPP Clinical Pharmacist Pager: [phone] Phone: [phone] 03/19/2017 12:03 PM

## 2017-03-19 NOTE — Patient Instructions (Signed)
Standing Labs We placed an order today for your standing lab work.    Please come back and get your standing labs in November 2018 and every 3 months.  We have open lab Monday through Friday from 8:30-11:30 AM and 1:30-4 PM at the office of Dr. Shaili Deveshwar.   The office is located at [address], [address], [place], [place] [address] No appointment is necessary.   Labs are drawn by [facility].  You may receive a bill from [facility] for your lab work. If you have any questions regarding directions or hours of operation,  please call [phone].     

## 2017-03-20 LAB — COMPLETE METABOLIC PANEL WITH GFR
ALK PHOS: 61 U/L (ref 40–115)
ALT: 39 U/L (ref 9–46)
AST: 26 U/L (ref 10–35)
Albumin: 4.5 g/dL (ref 3.6–5.1)
BUN: 13 mg/dL (ref 7–25)
CHLORIDE: 103 mmol/L (ref 98–110)
CO2: 25 mmol/L (ref 20–32)
CREATININE: 1.01 mg/dL (ref 0.70–1.33)
Calcium: 9.8 mg/dL (ref 8.6–10.3)
GFR, Est Non African American: 86 mL/min (ref >=60)
Glucose, Bld: 96 mg/dL (ref 65–99)
Potassium: 4.6 mmol/L (ref 3.5–5.3)
Sodium: 139 mmol/L (ref 135–146)
Total Bilirubin: 0.7 mg/dL (ref 0.2–1.2)
Total Protein: 7.4 g/dL (ref 6.1–8.1)

## 2017-03-20 NOTE — Progress Notes (Signed)
Within normal limits

## 2017-05-07 ENCOUNTER — Other Ambulatory Visit: Payer: Self-pay | Admitting: Rheumatology

## 2017-05-07 MED ORDER — SECUKINUMAB 150 MG/ML ~~LOC~~ SOAJ: 300.0000 mg | SUBCUTANEOUS | 0 refills | Status: DC

## 2017-05-07 NOTE — Telephone Encounter (Signed)
Last visit: 03/19/17 Next visit: 08/22/17 Labs: WNL 03/19/17  Tb gold: Negative 09/11/2016  Received refill request via fax from [facility].  Ok to refill cosentyx per Dr. Estanislado Pandy.

## 2017-05-24 ENCOUNTER — Other Ambulatory Visit: Payer: Self-pay | Admitting: Rheumatology

## 2017-05-27 NOTE — Telephone Encounter (Signed)
Last visit: 03/19/17 Next visit: 08/22/17 Labs: WNL 03/19/17   Okay to refill per Dr. Estanislado Pandy

## 2017-07-31 ENCOUNTER — Other Ambulatory Visit: Payer: Self-pay | Admitting: Rheumatology

## 2017-07-31 DIAGNOSIS — A630 Anogenital (venereal) warts: Secondary | ICD-10-CM | POA: Diagnosis not present

## 2017-07-31 DIAGNOSIS — L570 Actinic keratosis: Secondary | ICD-10-CM | POA: Diagnosis not present

## 2017-07-31 DIAGNOSIS — L309 Dermatitis, unspecified: Secondary | ICD-10-CM | POA: Diagnosis not present

## 2017-07-31 DIAGNOSIS — D225 Melanocytic nevi of trunk: Secondary | ICD-10-CM | POA: Diagnosis not present

## 2017-07-31 NOTE — Telephone Encounter (Signed)
Last visit: 03/19/17 Next visit: 08/22/17 Labs: 03/19/17 WNL TB Gold: 09/11/16 Neg   Left message to advise patient he is due for labs.  Okay to refill 30 day supply per Dr. Estanislado Pandy

## 2017-08-02 ENCOUNTER — Other Ambulatory Visit: Payer: Self-pay

## 2017-08-02 DIAGNOSIS — Z79899 Other long term (current) drug therapy: Secondary | ICD-10-CM

## 2017-08-04 LAB — CBC WITH DIFFERENTIAL/PLATELET
BASOS PCT: 0.6 %
Basophils Absolute: 29 cells/uL (ref 0–200)
Eosinophils Absolute: 29 cells/uL (ref 15–500)
Eosinophils Relative: 0.6 %
HCT: 44.1 % (ref 38.5–50.0)
Hemoglobin: 15.3 g/dL (ref 13.2–17.1)
Lymphs Abs: 1632 cells/uL (ref 850–3900)
MCH: 28.3 pg (ref 27.0–33.0)
MCHC: 34.7 g/dL (ref 32.0–36.0)
MCV: 81.7 fL (ref 80.0–100.0)
MPV: 10.1 fL (ref 7.5–12.5)
Monocytes Relative: 8.1 %
Neutro Abs: 2722 cells/uL (ref 1500–7800)
Neutrophils Relative %: 56.7 %
PLATELETS: 283 10*3/uL (ref 140–400)
RBC: 5.4 10*6/uL (ref 4.20–5.80)
RDW: 12.4 % (ref 11.0–15.0)
TOTAL LYMPHOCYTE: 34 %
WBC mixed population: 389 cells/uL (ref 200–950)
WBC: 4.8 10*3/uL (ref 3.8–10.8)

## 2017-08-04 LAB — COMPLETE METABOLIC PANEL WITH GFR
AG Ratio: 1.4 (calc) (ref 1.0–2.5)
ALBUMIN MSPROF: 4.4 g/dL (ref 3.6–5.1)
ALT: 29 U/L (ref 9–46)
AST: 20 U/L (ref 10–35)
Alkaline phosphatase (APISO): 68 U/L (ref 40–115)
BUN: 16 mg/dL (ref 7–25)
CALCIUM: 9.8 mg/dL (ref 8.6–10.3)
CO2: 29 mmol/L (ref 20–32)
CREATININE: 0.86 mg/dL (ref 0.70–1.33)
Chloride: 102 mmol/L (ref 98–110)
GFR, Est African American: 116 mL/min/{1.73_m2} (ref >=60)
GFR, Est Non African American: 100 mL/min/{1.73_m2} (ref >=60)
GLOBULIN: 3.2 g/dL (ref 1.9–3.7)
Glucose, Bld: 100 mg/dL — ABNORMAL HIGH (ref 65–99)
Potassium: 4.4 mmol/L (ref 3.5–5.3)
SODIUM: 140 mmol/L (ref 135–146)
TOTAL PROTEIN: 7.6 g/dL (ref 6.1–8.1)
Total Bilirubin: 0.7 mg/dL (ref 0.2–1.2)

## 2017-08-04 LAB — QUANTIFERON-TB GOLD PLUS
Mitogen-NIL: >10 IU/mL
NIL: 0.12 [IU]/mL
QUANTIFERON-TB GOLD PLUS: NEGATIVE
TB2-NIL: <0 IU/mL

## 2017-08-05 NOTE — Progress Notes (Signed)
WNL

## 2017-08-08 NOTE — Progress Notes (Deleted)
   Office Visit Note  Patient: Mitchell Morris             Date of Birth: [birth year]           MRN: [id number]             PCP: Kathyrn Lass, MD Referring: Kathyrn Lass, MD Visit Date: 08/22/2017 Occupation: @GUAROCC @    Subjective:  No chief complaint on file.   History of Present Illness: Mitchell Morris is a 52 y.o. male ***   Activities of Daily Living:  Patient reports morning stiffness for *** {minute/hour:19697}.   Patient {ACTIONS;DENIES/REPORTS:21021675::"Denies"} nocturnal pain.  Difficulty dressing/grooming: {ACTIONS;DENIES/REPORTS:21021675::"Denies"} Difficulty climbing stairs: {ACTIONS;DENIES/REPORTS:21021675::"Denies"} Difficulty getting out of chair: {ACTIONS;DENIES/REPORTS:21021675::"Denies"} Difficulty using hands for taps, buttons, cutlery, and/or writing: {ACTIONS;DENIES/REPORTS:21021675::"Denies"}   No Rheumatology ROS completed.   PMFS History:  Patient Active Problem List   Diagnosis Date Noted  . Primary osteoarthritis of both hands 03/12/2017  . Primary osteoarthritis of both feet 03/12/2017  . DDD (degenerative disc disease), lumbar 03/12/2017  . Alcohol use/ keep Methotrexate dose low  03/12/2017  . Psoriatic arthritis ([place]) 09/27/2016  . Psoriasis 09/27/2016  . High risk medication use 09/27/2016    No past medical history on file.  No family history on file. No past surgical history on file. Social History   Social History Narrative  . Not on file     Objective: Vital Signs: There were no vitals taken for this visit.   Physical Exam   Musculoskeletal Exam: ***  CDAI Exam: No CDAI exam completed.    Investigation: No additional findings.TB Gold: 08/02/2017 Negative  CBC Latest Ref Rng & Units 08/02/2017 03/19/2017 12/03/2016  WBC 3.8 - 10.8 Thousand/uL 4.8 4.1 6.4  Hemoglobin 13.2 - 17.1 g/dL 15.3 15.1 15.2  Hematocrit 38.5 - 50.0 % 44.1 44.8 44.3  Platelets 140 - 400 Thousand/uL 283 256 286   CMP Latest Ref Rng & Units  08/02/2017 03/19/2017 12/03/2016  Glucose 65 - 99 mg/dL 100(H) 96 86  BUN 7 - 25 mg/dL 16 13 13   Creatinine 0.70 - 1.33 mg/dL 0.86 1.01 1.02  Sodium 135 - 146 mmol/L 140 139 141  Potassium 3.5 - 5.3 mmol/L 4.4 4.6 3.9  Chloride 98 - 110 mmol/L 102 103 102  CO2 20 - 32 mmol/L 29 25 27   Calcium 8.6 - 10.3 mg/dL 9.8 9.8 9.9  Total Protein 6.1 - 8.1 g/dL 7.6 7.4 7.5  Total Bilirubin 0.2 - 1.2 mg/dL 0.7 0.7 0.6  Alkaline Phos 40 - 115 U/L - 61 59  AST 10 - 35 U/L 20 26 22   ALT 9 - 46 U/L 29 39 24    Imaging: No results found.  Speciality Comments: No specialty comments available.    Procedures:  No procedures performed Allergies: Patient has no known allergies.   Assessment / Plan:     Visit Diagnoses: No diagnosis found.    Orders: No orders of the defined types were placed in this encounter.  No orders of the defined types were placed in this encounter.   Face-to-face time spent with patient was *** minutes. 50% of time was spent in counseling and coordination of care.  Follow-Up Instructions: No Follow-up on file.   Earnestine Mealing, CMA  Note - This record has been created using Editor, commissioning.  Chart creation errors have been sought, but may not always  have been located. Such creation errors do not reflect on  the standard of medical care.

## 2017-08-22 ENCOUNTER — Ambulatory Visit: Payer: BLUE CROSS/BLUE SHIELD | Admitting: Rheumatology

## 2017-08-29 ENCOUNTER — Other Ambulatory Visit: Payer: Self-pay | Admitting: *Deleted

## 2017-08-29 MED ORDER — SECUKINUMAB 150 MG/ML ~~LOC~~ SOAJ: 300.0000 mg | SUBCUTANEOUS | 0 refills | Status: DC

## 2017-08-29 NOTE — Telephone Encounter (Signed)
Last visit: 03/19/17 Next visit: 09/18/17 Labs: 08/02/17 WNL TB Gold:08/02/17 Neg   Okay to refill per Dr. Estanislado Pandy

## 2017-09-04 NOTE — Progress Notes (Signed)
Office Visit Note  Patient: Mitchell Morris             Date of Birth: [birth year]           MRN: [id number]             PCP: Kathyrn Lass, MD Referring: Kathyrn Lass, MD Visit Date: 09/18/2017 Occupation: @GUAROCC @    Subjective:  Medication management   History of Present Illness: Mitchell Morris is a 52 y.o. male history of psoriatic arthritis and psoriasis.  He states he is doing quite well on combination of methotrexate and Cosyntex.  He has occasional arthralgias but no joint swelling.  His psoriasis is quite well controlled except for one patch on his lower extremity.  Activities of Daily Living:  Patient reports morning stiffness for 0 minute.   Patient Denies nocturnal pain.  Difficulty dressing/grooming: Denies Difficulty climbing stairs: Denies Difficulty getting out of chair: Denies Difficulty using hands for taps, buttons, cutlery, and/or writing: Denies   Review of Systems  Constitutional: Negative for fatigue, night sweats and weakness.  HENT: Negative for mouth sores, mouth dryness and nose dryness.   Eyes: Negative for redness and dryness.  Respiratory: Negative for shortness of breath and difficulty breathing.   Cardiovascular: Negative for chest pain, palpitations, hypertension, irregular heartbeat and swelling in legs/feet.  Gastrointestinal: Negative for constipation, diarrhea and nausea.  Endocrine: Negative for heat intolerance and increased urination.  Genitourinary: Negative for pelvic pain.  Musculoskeletal: Negative for arthralgias, joint pain, joint swelling, myalgias, muscle weakness, morning stiffness, muscle tenderness and myalgias.  Skin: Positive for rash. Negative for color change, hair loss, nodules/bumps, skin tightness, ulcers and sensitivity to sunlight.  Allergic/Immunologic: Negative for susceptible to infections.  Neurological: Negative for dizziness, fainting, headaches, memory loss and night sweats.  Hematological: Negative  for bruising/bleeding tendency and swollen glands.  Psychiatric/Behavioral: Negative for depressed mood and sleep disturbance. The patient is not nervous/anxious.     PMFS History:  Patient Active Problem List   Diagnosis Date Noted  . Primary osteoarthritis of both hands 03/12/2017  . Primary osteoarthritis of both feet 03/12/2017  . DDD (degenerative disc disease), lumbar 03/12/2017  . Alcohol use/ keep Methotrexate dose low  03/12/2017  . Psoriatic arthritis ([place]) 09/27/2016  . Psoriasis 09/27/2016  . High risk medication use 09/27/2016    History reviewed. No pertinent past medical history.  Family History  Problem Relation Age of Onset  . Alzheimer's disease Mother   . Hypertension Father   . Cancer Brother        testicular   . Healthy Daughter   . Healthy Son    History reviewed. No pertinent surgical history. Social History   Social History Narrative  . Not on file     Objective: Vital Signs: BP (!) 143/92 (BP Location: Left Arm, Patient Position: Sitting, Cuff Size: Normal)   Pulse 85   Resp 15   Ht 6' (1.829 m)   Wt 212 lb (96.2 kg)   BMI 28.75 kg/m    Physical Exam  Constitutional: He is oriented to person, place, and time. He appears well-developed and well-nourished.  HENT:  Head: Normocephalic and atraumatic.  Eyes: Conjunctivae and EOM are normal. Pupils are equal, round, and reactive to light.  Neck: Normal range of motion. Neck supple.  Cardiovascular: Normal rate, regular rhythm and normal heart sounds.  Pulmonary/Chest: Effort normal and breath sounds normal.  Abdominal: Soft. Bowel sounds are normal.  Neurological: He is alert and oriented to  person, place, and time.  Skin: Skin is warm and dry. Capillary refill takes less than 2 seconds. Rash noted.  Small patch on the left lower extremity  Psychiatric: He has a normal mood and affect. His behavior is normal.  Nursing note and vitals reviewed.    Musculoskeletal Exam: C-spine thoracic  lumbar spine good range of motion.  No SI joint tenderness was noted.  He has some DIP PIP thickening in his hands and feet with no synovitis and no dactylitis..  CDAI Exam: CDAI Homunculus Exam:   Joint Counts:  CDAI Tender Joint count: 0 CDAI Swollen Joint count: 0  Global Assessments:  Patient Global Assessment: 1 Provider Global Assessment: 1  CDAI Calculated Score: 2    Investigation: No additional findings.TB Gold: 08/02/2017 Negative  CBC Latest Ref Rng & Units 08/02/2017 03/19/2017 12/03/2016  WBC 3.8 - 10.8 Thousand/uL 4.8 4.1 6.4  Hemoglobin 13.2 - 17.1 g/dL 15.3 15.1 15.2  Hematocrit 38.5 - 50.0 % 44.1 44.8 44.3  Platelets 140 - 400 Thousand/uL 283 256 286   CMP Latest Ref Rng & Units 08/02/2017 03/19/2017 12/03/2016  Glucose 65 - 99 mg/dL 100(H) 96 86  BUN 7 - 25 mg/dL 16 13 13   Creatinine 0.70 - 1.33 mg/dL 0.86 1.01 1.02  Sodium 135 - 146 mmol/L 140 139 141  Potassium 3.5 - 5.3 mmol/L 4.4 4.6 3.9  Chloride 98 - 110 mmol/L 102 103 102  CO2 20 - 32 mmol/L 29 25 27   Calcium 8.6 - 10.3 mg/dL 9.8 9.8 9.9  Total Protein 6.1 - 8.1 g/dL 7.6 7.4 7.5  Total Bilirubin 0.2 - 1.2 mg/dL 0.7 0.7 0.6  Alkaline Phos 40 - 115 U/L - 61 59  AST 10 - 35 U/L 20 26 22   ALT 9 - 46 U/L 29 39 24    Imaging: No results found.  Speciality Comments: No specialty comments available.    Procedures:  No procedures performed Allergies: Patient has no known allergies.   Assessment / Plan:     Visit Diagnoses: Psoriatic arthritis ([place]): Psoriatic arthritis is very well controlled on combination of Cosyntex and methotrexate.  We will continue current treatment. he had no synovitis on examination today.  His ductulitis is resolved.  Psoriasis: He has only one small patch of psoriasis on his left lower extremity.  High risk medication use - Methotrexate 4 tablets by mouth every week, folic acid 1 mg by mouth daily and Cosentyx 300 mg subcutaneous every month(Humira, stelara-failed).  His labs are  stable we will continue to monitor his labs every 3 months.  Primary osteoarthritis of both hands: Joint protection muscle strengthening discussed.  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  Alcohol use/ keep Methotrexate dose low     Orders: No orders of the defined types were placed in this encounter.  No orders of the defined types were placed in this encounter.   Face-to-face time spent with patient was 20 minutes.  Greater than 50% of time was spent in counseling and coordination of care.  Follow-Up Instructions: Return in about 5 months (around 02/15/2018) for Psoriatic arthritis, Osteoarthritis.   Bo Merino, MD  Note - This record has been created using Editor, commissioning.  Chart creation errors have been sought, but may not always  have been located. Such creation errors do not reflect on  the standard of medical care.

## 2017-09-18 ENCOUNTER — Ambulatory Visit: Payer: BLUE CROSS/BLUE SHIELD | Admitting: Rheumatology

## 2017-09-18 ENCOUNTER — Encounter: Payer: Self-pay | Admitting: Rheumatology

## 2017-09-18 VITALS — BP 143/92 | HR 85 | Resp 15 | Ht 72.0 in | Wt 212.0 lb

## 2017-09-18 DIAGNOSIS — L409 Psoriasis, unspecified: Secondary | ICD-10-CM | POA: Diagnosis not present

## 2017-09-18 DIAGNOSIS — Z7289 Other problems related to lifestyle: Secondary | ICD-10-CM

## 2017-09-18 DIAGNOSIS — Z79899 Other long term (current) drug therapy: Secondary | ICD-10-CM | POA: Diagnosis not present

## 2017-09-18 DIAGNOSIS — L4050 Arthropathic psoriasis, unspecified: Secondary | ICD-10-CM | POA: Diagnosis not present

## 2017-09-18 DIAGNOSIS — M19042 Primary osteoarthritis, left hand: Secondary | ICD-10-CM | POA: Diagnosis not present

## 2017-09-18 DIAGNOSIS — M19041 Primary osteoarthritis, right hand: Secondary | ICD-10-CM

## 2017-09-18 DIAGNOSIS — M5136 Other intervertebral disc degeneration, lumbar region: Secondary | ICD-10-CM | POA: Diagnosis not present

## 2017-09-18 DIAGNOSIS — M19071 Primary osteoarthritis, right ankle and foot: Secondary | ICD-10-CM | POA: Diagnosis not present

## 2017-09-18 DIAGNOSIS — F1090 Alcohol use, unspecified, uncomplicated: Secondary | ICD-10-CM

## 2017-09-18 DIAGNOSIS — M19072 Primary osteoarthritis, left ankle and foot: Secondary | ICD-10-CM | POA: Diagnosis not present

## 2017-09-18 DIAGNOSIS — Z789 Other specified health status: Secondary | ICD-10-CM | POA: Diagnosis not present

## 2017-09-18 DIAGNOSIS — M51369 Other intervertebral disc degeneration, lumbar region without mention of lumbar back pain or lower extremity pain: Secondary | ICD-10-CM

## 2017-09-18 NOTE — Patient Instructions (Signed)
Standing Labs We placed an order today for your standing lab work.    Please come back and get your standing labs in April and every 3 months  We have open lab Monday through Friday from 8:30-11:30 AM and 1:30-4 PM at the office of Dr. Cinthya Bors.   The office is located at [address], [address], [place], [place] [address] No appointment is necessary.   Labs are drawn by [facility].  You may receive a bill from [facility] for your lab work. If you have any questions regarding directions or hours of operation,  please call [phone].    

## 2017-10-03 DIAGNOSIS — J020 Streptococcal pharyngitis: Secondary | ICD-10-CM | POA: Diagnosis not present

## 2017-10-03 DIAGNOSIS — R509 Fever, unspecified: Secondary | ICD-10-CM | POA: Diagnosis not present

## 2017-10-31 ENCOUNTER — Other Ambulatory Visit: Payer: Self-pay | Admitting: Rheumatology

## 2017-10-31 NOTE — Telephone Encounter (Signed)
Last Visit: 09/18/17 Next Visit: 02/21/18 Labs: 08/02/17 WNL TB Gold: 08/02/17 Neg   Okay to refill per Dr. Estanislado Pandy

## 2017-11-11 ENCOUNTER — Telehealth: Payer: Self-pay | Admitting: Rheumatology

## 2017-11-11 NOTE — Telephone Encounter (Signed)
Spoke with pharmacist and advised it is 3 boxes- 6 pens. She noted patient's chart and will get prescription ready for patient.

## 2017-11-11 NOTE — Telephone Encounter (Signed)
Aaron Edelman from [facility] called to confirm the quantity of Cosentyx as 3.  Please call [phone]

## 2017-11-25 DIAGNOSIS — L821 Other seborrheic keratosis: Secondary | ICD-10-CM | POA: Diagnosis not present

## 2018-01-02 DIAGNOSIS — J029 Acute pharyngitis, unspecified: Secondary | ICD-10-CM | POA: Diagnosis not present

## 2018-01-02 DIAGNOSIS — J020 Streptococcal pharyngitis: Secondary | ICD-10-CM | POA: Diagnosis not present

## 2018-02-07 ENCOUNTER — Other Ambulatory Visit: Payer: Self-pay | Admitting: Rheumatology

## 2018-02-07 NOTE — Progress Notes (Signed)
Office Visit Note  Patient: Mitchell Morris             Date of Birth: [birth year]           MRN: [id number]             PCP: Kathyrn Lass, MD Referring: Kathyrn Lass, MD Visit Date: 02/21/2018 Occupation: @GUAROCC @  Subjective:  Hand stiffness   History of Present Illness: Mitchell Morris is a 52 y.o. male with history of psoriatic arthritis, osteoarthritis, and DDD.  He injects Cosentyx 300 mg subcutaneously once a month. He has not been taking MTX. He has been tolerating Cosentyx.  He has not had any diarrhea or blood in stool. He reports he has noticed increased joint stiffness since being off of MTX.  He states he has noticed decreased grip strength.  He states he continues to have lower back pain.  He states he also has increased lower back stiffness. He denies any psoriasis at this time.  He followed up with a dermatologist last week.  He denies any achilles tendonitis or plantar fasciitis.    Activities of Daily Living:  Patient reports morning stiffness for 2 minutes.   Patient Denies nocturnal pain.  Difficulty dressing/grooming: Reports Difficulty climbing stairs: Denies Difficulty getting out of chair: Denies Difficulty using hands for taps, buttons, cutlery, and/or writing: Denies  Review of Systems  Constitutional: Negative for fatigue and night sweats.  HENT: Negative for mouth sores, trouble swallowing, trouble swallowing, mouth dryness and nose dryness.   Eyes: Negative for redness, visual disturbance and dryness.  Respiratory: Negative for cough, hemoptysis, shortness of breath and difficulty breathing.   Cardiovascular: Negative for chest pain, palpitations, hypertension, irregular heartbeat and swelling in legs/feet.  Gastrointestinal: Negative for blood in stool, constipation and diarrhea.  Endocrine: Negative for excessive thirst and increased urination.  Genitourinary: Negative for difficulty urinating and painful urination.  Musculoskeletal: Positive  for arthralgias, joint pain and joint swelling. Negative for myalgias, muscle weakness, morning stiffness, muscle tenderness and myalgias.  Skin: Negative for color change, rash, hair loss, nodules/bumps, skin tightness, ulcers and sensitivity to sunlight.  Allergic/Immunologic: Negative for susceptible to infections.  Neurological: Negative for dizziness, fainting, memory loss and night sweats.  Hematological: Negative for swollen glands.  Psychiatric/Behavioral: Negative for depressed mood and sleep disturbance. The patient is not nervous/anxious.     PMFS History:  Patient Active Problem List   Diagnosis Date Noted  . Primary osteoarthritis of both hands 03/12/2017  . Primary osteoarthritis of both feet 03/12/2017  . DDD (degenerative disc disease), lumbar 03/12/2017  . Alcohol use/ keep Methotrexate dose low  03/12/2017  . Psoriatic arthritis ([place]) 09/27/2016  . Psoriasis 09/27/2016  . High risk medication use 09/27/2016    Past Medical History:  Diagnosis Date  . Psoriatic arthritis ([place])     Family History  Problem Relation Age of Onset  . Alzheimer's disease Mother   . Hypertension Father   . Cancer Brother        testicular   . Healthy Daughter   . Healthy Son    Past Surgical History:  Procedure Laterality Date  . KNEE ARTHROPLASTY     Social History   Social History Narrative  . Not on file    Objective: Vital Signs: BP 130/81 (BP Location: Left Arm, Patient Position: Sitting, Cuff Size: Normal)   Pulse 73   Resp 14   Ht 6' (1.829 m)   Wt 207 lb (93.9 kg)   BMI  28.07 kg/m    Physical Exam  Constitutional: He is oriented to person, place, and time. He appears well-developed and well-nourished.  HENT:  Head: Normocephalic and atraumatic.  Eyes: Pupils are equal, round, and reactive to light. Conjunctivae and EOM are normal.  Neck: Normal range of motion. Neck supple.  Cardiovascular: Normal rate, regular rhythm and normal heart sounds.    Pulmonary/Chest: Effort normal and breath sounds normal.  Abdominal: Soft. Bowel sounds are normal.  Lymphadenopathy:    He has no cervical adenopathy.  Neurological: He is alert and oriented to person, place, and time.  Skin: Skin is warm and dry. Capillary refill takes less than 2 seconds.  Psychiatric: He has a normal mood and affect. His behavior is normal.  Nursing note and vitals reviewed.    Musculoskeletal Exam: C-spine, thoracic spine, and lumbar spine good ROM.  No midline spinal tenderness.  No SI joint tenderness.  Shoulder joints, elbow joints, wrist joints, MCPs, PIPs, and DIPs good ROM with no synovitis.  PIP and DIP synovial thickening consistent with osteoarthritis.  Hip joints, knee joints, ankle joints, MTPs, PIPs, and DIPs good ROM with no synovitis.  No warmth or effusion of knee joints.  No plantar fascia tenderness or achilles tendonitis.   CDAI Exam: CDAI Homunculus Exam:   Tenderness:  Right hand: 3rd DIP  Joint Counts:  CDAI Tender Joint count: 0 CDAI Swollen Joint count: 0  Global Assessments:  Patient Global Assessment: 2 Provider Global Assessment: 2  CDAI Calculated Score: 4   Investigation: No additional findings.  Imaging: No results found.  Recent Labs: Lab Results  Component Value Date   WBC 4.8 08/02/2017   HGB 15.3 08/02/2017   PLT 283 08/02/2017   NA 140 08/02/2017   K 4.4 08/02/2017   CL 102 08/02/2017   CO2 29 08/02/2017   GLUCOSE 100 (H) 08/02/2017   BUN 16 08/02/2017   CREATININE 0.86 08/02/2017   BILITOT 0.7 08/02/2017   ALKPHOS 61 03/19/2017   AST 20 08/02/2017   ALT 29 08/02/2017   PROT 7.6 08/02/2017   ALBUMIN 4.5 03/19/2017   CALCIUM 9.8 08/02/2017   GFRAA 116 08/02/2017    Speciality Comments: No specialty comments available.  Procedures:  No procedures performed Allergies: Patient has no known allergies.   Assessment / Plan:     Visit Diagnoses: Psoriatic arthritis ([place]): He has no synovitis or  dactylitis on exam.  He has not had any recent flares.  He has noticed increased joint stiffness and hand pain since being off of methotrexate.  He reports he ran out of MTX and did not ever get it refilled.  He has no achilles tendonitis or plantar fasciitis.  He has no SI joint tenderness on exam. Since he is clinically doing well we will not restart him on MTX at this time.  He will continue injecting Cosentyx 300 mg subcutaneously once a month.  He was advised to notify us if he develops increased joint pain or joint swelling.     Psoriasis: He has no psoriasis at this time.   High risk medication use - Cosentyx 300 mg subcutaneous every month(Humira, stelara-failed). D/c MTX due to clinically doing well.  CBC and CMP orders were placed.  He is planning to go to [facility] in [place] to get his labs.  - Plan: CBC with Differential/Platelet, COMPLETE METABOLIC PANEL WITH GFR  Primary osteoarthritis of both hands: He has PIP and DIP synovial thickening consistent with osteoarthritis.  Joint protection and  muscle strengthening were discussed.    Primary osteoarthritis of both feet: He has no discomfort in his feet at this time.   DDD (degenerative disc disease), lumbar:  No midline spinal tenderness.    Alcohol use/ keep Methotrexate dose low: He is not going to restart on MTX.   Orders: Orders Placed This Encounter  Procedures  . CBC with Differential/Platelet  . COMPLETE METABOLIC PANEL WITH GFR   No orders of the defined types were placed in this encounter.     Follow-Up Instructions: Return for Psoriatic arthritis, Osteoarthritis, DDD.   Ofilia Neas, PA-C  Note - This record has been created using Dragon software.  Chart creation errors have been sought, but may not always  have been located. Such creation errors do not reflect on  the standard of medical care.

## 2018-02-10 NOTE — Telephone Encounter (Addendum)
Last Visit: 09/18/17 Next Visit: 02/21/18 Labs: 08/02/17 WNL TB Gold: 08/02/17 Neg   Left message to advise patient he is due for labs.

## 2018-02-11 DIAGNOSIS — D2221 Melanocytic nevi of right ear and external auricular canal: Secondary | ICD-10-CM | POA: Diagnosis not present

## 2018-02-11 DIAGNOSIS — D2271 Melanocytic nevi of right lower limb, including hip: Secondary | ICD-10-CM | POA: Diagnosis not present

## 2018-02-11 DIAGNOSIS — D485 Neoplasm of uncertain behavior of skin: Secondary | ICD-10-CM | POA: Diagnosis not present

## 2018-02-11 DIAGNOSIS — Z86018 Personal history of other benign neoplasm: Secondary | ICD-10-CM | POA: Diagnosis not present

## 2018-02-11 DIAGNOSIS — D2272 Melanocytic nevi of left lower limb, including hip: Secondary | ICD-10-CM | POA: Diagnosis not present

## 2018-02-11 DIAGNOSIS — D225 Melanocytic nevi of trunk: Secondary | ICD-10-CM | POA: Diagnosis not present

## 2018-02-18 ENCOUNTER — Other Ambulatory Visit: Payer: Self-pay | Admitting: *Deleted

## 2018-02-18 DIAGNOSIS — Z9225 Personal history of immunosupression therapy: Secondary | ICD-10-CM

## 2018-02-18 DIAGNOSIS — Z79899 Other long term (current) drug therapy: Secondary | ICD-10-CM

## 2018-02-21 ENCOUNTER — Ambulatory Visit: Payer: BLUE CROSS/BLUE SHIELD | Admitting: Physician Assistant

## 2018-02-21 ENCOUNTER — Encounter: Payer: Self-pay | Admitting: Physician Assistant

## 2018-02-21 VITALS — BP 130/81 | HR 73 | Resp 14 | Ht 72.0 in | Wt 207.0 lb

## 2018-02-21 DIAGNOSIS — M19042 Primary osteoarthritis, left hand: Secondary | ICD-10-CM

## 2018-02-21 DIAGNOSIS — M51369 Other intervertebral disc degeneration, lumbar region without mention of lumbar back pain or lower extremity pain: Secondary | ICD-10-CM

## 2018-02-21 DIAGNOSIS — F1090 Alcohol use, unspecified, uncomplicated: Secondary | ICD-10-CM

## 2018-02-21 DIAGNOSIS — Z7289 Other problems related to lifestyle: Secondary | ICD-10-CM

## 2018-02-21 DIAGNOSIS — L4050 Arthropathic psoriasis, unspecified: Secondary | ICD-10-CM

## 2018-02-21 DIAGNOSIS — M19071 Primary osteoarthritis, right ankle and foot: Secondary | ICD-10-CM

## 2018-02-21 DIAGNOSIS — L409 Psoriasis, unspecified: Secondary | ICD-10-CM | POA: Diagnosis not present

## 2018-02-21 DIAGNOSIS — M19041 Primary osteoarthritis, right hand: Secondary | ICD-10-CM | POA: Diagnosis not present

## 2018-02-21 DIAGNOSIS — M5136 Other intervertebral disc degeneration, lumbar region: Secondary | ICD-10-CM

## 2018-02-21 DIAGNOSIS — Z789 Other specified health status: Secondary | ICD-10-CM

## 2018-02-21 DIAGNOSIS — Z79899 Other long term (current) drug therapy: Secondary | ICD-10-CM | POA: Diagnosis not present

## 2018-02-21 DIAGNOSIS — M19072 Primary osteoarthritis, left ankle and foot: Secondary | ICD-10-CM

## 2018-02-21 NOTE — Patient Instructions (Signed)
Standing Labs We placed an order today for your standing lab work.   Please come back and get your standing labs in October and every 3 months   We have open lab Monday through Friday from 8:30-11:30 AM and 1:30-4:00 PM  at the office of Dr. Shaili Deveshwar.   You may experience shorter wait times on Monday and Friday afternoons. The office is located at [address], [address], [place],  [address] No appointment is necessary.   Labs are drawn by [facility].  You may receive a bill from [facility] for your lab work. If you have any questions regarding directions or hours of operation,  please call [phone].    

## 2018-03-13 DIAGNOSIS — L03011 Cellulitis of right finger: Secondary | ICD-10-CM | POA: Diagnosis not present

## 2018-03-18 DIAGNOSIS — L409 Psoriasis, unspecified: Secondary | ICD-10-CM | POA: Diagnosis not present

## 2018-03-18 DIAGNOSIS — L4050 Arthropathic psoriasis, unspecified: Secondary | ICD-10-CM | POA: Diagnosis not present

## 2018-03-18 DIAGNOSIS — Z79899 Other long term (current) drug therapy: Secondary | ICD-10-CM | POA: Diagnosis not present

## 2018-03-18 DIAGNOSIS — Z9225 Personal history of immunosupression therapy: Secondary | ICD-10-CM | POA: Diagnosis not present

## 2018-03-20 ENCOUNTER — Telehealth: Payer: Self-pay | Admitting: *Deleted

## 2018-03-20 LAB — COMPLETE METABOLIC PANEL WITH GFR
AG Ratio: 1.5 (calc) (ref 1.0–2.5)
ALT: 28 U/L (ref 9–46)
AST: 25 U/L (ref 10–35)
Albumin: 4.6 g/dL (ref 3.6–5.1)
Alkaline phosphatase (APISO): 65 U/L (ref 40–115)
BILIRUBIN TOTAL: 0.6 mg/dL (ref 0.2–1.2)
BUN: 15 mg/dL (ref 7–25)
CHLORIDE: 101 mmol/L (ref 98–110)
CO2: 27 mmol/L (ref 20–32)
Calcium: 9.6 mg/dL (ref 8.6–10.3)
Creat: 1.11 mg/dL (ref 0.70–1.33)
GFR, Est African American: 89 mL/min/{1.73_m2} (ref >=60)
GFR, Est Non African American: 76 mL/min/{1.73_m2} (ref >=60)
GLUCOSE: 97 mg/dL (ref 65–99)
Globulin: 3.1 g/dL (calc) (ref 1.9–3.7)
Potassium: 5 mmol/L (ref 3.5–5.3)
Sodium: 137 mmol/L (ref 135–146)
TOTAL PROTEIN: 7.7 g/dL (ref 6.1–8.1)

## 2018-03-20 LAB — CBC WITH DIFFERENTIAL/PLATELET
BASOS PCT: 0.8 %
Basophils Absolute: 31 cells/uL (ref 0–200)
EOS PCT: 0.5 %
Eosinophils Absolute: 20 cells/uL (ref 15–500)
HCT: 44.2 % (ref 38.5–50.0)
HEMOGLOBIN: 15.3 g/dL (ref 13.2–17.1)
Lymphs Abs: 1435 cells/uL (ref 850–3900)
MCH: 28.2 pg (ref 27.0–33.0)
MCHC: 34.6 g/dL (ref 32.0–36.0)
MCV: 81.5 fL (ref 80.0–100.0)
MPV: 10.5 fL (ref 7.5–12.5)
Monocytes Relative: 8.4 %
NEUTROS ABS: 2087 {cells}/uL (ref 1500–7800)
Neutrophils Relative %: 53.5 %
Platelets: 284 10*3/uL (ref 140–400)
RBC: 5.42 10*6/uL (ref 4.20–5.80)
RDW: 12.7 % (ref 11.0–15.0)
Total Lymphocyte: 36.8 %
WBC mixed population: 328 cells/uL (ref 200–950)
WBC: 3.9 10*3/uL (ref 3.8–10.8)

## 2018-03-20 LAB — QUANTIFERON-TB GOLD PLUS
Mitogen-NIL: >10 IU/mL
NIL: 0.03 IU/mL
QuantiFERON-TB Gold Plus: NEGATIVE
TB1-NIL: 0.12 IU/mL
TB2-NIL: 0.14 [IU]/mL

## 2018-03-20 MED ORDER — SECUKINUMAB 150 MG/ML ~~LOC~~ SOAJ: 300.0000 mg | SUBCUTANEOUS | 0 refills | Status: DC

## 2018-03-20 NOTE — Telephone Encounter (Signed)
Last Visit: 02/21/18 Next visit: 08/05/18  Okay to refill Cosentyx per Dr. Estanislado Pandy

## 2018-03-20 NOTE — Telephone Encounter (Signed)
-----   Message from Ofilia Neas, PA-C sent at 03/20/2018  4:10 PM EDT ----- TB gold negative.  CBC and CMP WNL.

## 2018-04-16 DIAGNOSIS — D485 Neoplasm of uncertain behavior of skin: Secondary | ICD-10-CM | POA: Diagnosis not present

## 2018-04-16 DIAGNOSIS — D225 Melanocytic nevi of trunk: Secondary | ICD-10-CM | POA: Diagnosis not present

## 2018-05-13 DIAGNOSIS — D225 Melanocytic nevi of trunk: Secondary | ICD-10-CM | POA: Diagnosis not present

## 2018-05-13 DIAGNOSIS — D485 Neoplasm of uncertain behavior of skin: Secondary | ICD-10-CM | POA: Diagnosis not present

## 2018-05-27 NOTE — Progress Notes (Signed)
Office Visit Note  Patient: Mitchell Morris             Date of Birth: [birth year]           MRN: [id number]             PCP: Kathyrn Lass, MD Referring: Kathyrn Lass, MD Visit Date: 05/30/2018 Occupation: @GUAROCC @  Subjective:  Pain in left thumb.   History of Present Illness: Mitchell Morris is a 52 y.o. male with history of psoriatic arthritis psoriasis and osteoarthritis.  He has been on Cosentyx 300 mg subcu every month.  He denies any joint swelling.  He states for the last 4 weeks he has been having increased pain and discomfort in his left thumb.  He also experiencing some tingling sensation in his left thumb.  He has been rolling mats at work which is a repeated motion.  None of the other fingers have been painful.  He had a flare in his SI joints about 12 weeks ago which lasted for some time and then resolved.  No active psoriasis lesions.  Activities of Daily Living:  Patient reports morning stiffness for 0 minutes.   Patient Denies nocturnal pain.  Difficulty dressing/grooming: Denies Difficulty climbing stairs: Denies Difficulty getting out of chair: Denies Difficulty using hands for taps, buttons, cutlery, and/or writing: Denies  Review of Systems  Constitutional: Negative for fatigue.  HENT: Negative for mouth sores, trouble swallowing, trouble swallowing and mouth dryness.   Eyes: Negative for pain, redness, itching and dryness.  Respiratory: Negative for shortness of breath, wheezing and difficulty breathing.   Cardiovascular: Negative for chest pain, palpitations and swelling in legs/feet.  Gastrointestinal: Negative for abdominal pain, constipation, diarrhea, nausea and vomiting.  Endocrine: Negative for increased urination.  Genitourinary: Negative for painful urination, pelvic pain and urgency.  Musculoskeletal: Positive for arthralgias and joint pain. Negative for joint swelling and morning stiffness.  Skin: Negative for rash and hair loss.    Allergic/Immunologic: Negative for susceptible to infections.  Neurological: Positive for numbness. Negative for dizziness, light-headedness, headaches, memory loss and weakness.  Hematological: Negative for bruising/bleeding tendency.  Psychiatric/Behavioral: Negative for confusion.    PMFS History:  Patient Active Problem List   Diagnosis Date Noted  . Primary osteoarthritis of both hands 03/12/2017  . Primary osteoarthritis of both feet 03/12/2017  . DDD (degenerative disc disease), lumbar 03/12/2017  . Alcohol use/ keep Methotrexate dose low  03/12/2017  . Psoriatic arthritis ([place]) 09/27/2016  . Psoriasis 09/27/2016  . High risk medication use 09/27/2016    Past Medical History:  Diagnosis Date  . Psoriatic arthritis ([place])     Family History  Problem Relation Age of Onset  . Alzheimer's disease Mother   . Hypertension Father   . Cancer Brother        testicular   . Healthy Daughter   . Healthy Son    Past Surgical History:  Procedure Laterality Date  . KNEE ARTHROPLASTY     Social History   Social History Narrative  . Not on file    Objective: Vital Signs: BP (!) 154/94 (BP Location: Left Arm, Patient Position: Sitting, Cuff Size: Normal)   Pulse 78   Resp 15   Ht 6' (1.829 m)   Wt 211 lb (95.7 kg)   BMI 28.62 kg/m    Physical Exam  Constitutional: He is oriented to person, place, and time. He appears well-developed and well-nourished.  HENT:  Head: Normocephalic and atraumatic.  Eyes: Pupils are  equal, round, and reactive to light. Conjunctivae and EOM are normal.  Neck: Normal range of motion. Neck supple.  Cardiovascular: Normal rate, regular rhythm and normal heart sounds.  Pulmonary/Chest: Effort normal and breath sounds normal.  Abdominal: Soft. Bowel sounds are normal.  Neurological: He is alert and oriented to person, place, and time.  Skin: Skin is warm and dry. Capillary refill takes less than 2 seconds.  Psychiatric: He has a normal mood  and affect. His behavior is normal.  Nursing note and vitals reviewed.    Musculoskeletal Exam: C-spine thoracic lumbar spine good range of motion.  Shoulder joints elbow joints wrist joint MCPs PIPs DIPs were in good range of motion with no synovitis.  He had tenderness over the flexor tendons of his left wrist.  He has been experiencing some numbness in his left thumb.  Hip joints knee joints ankles MTPs PIPs been good range of motion.  CDAI Exam: CDAI Score: 0.4  Patient Global Assessment: 2 (mm); Provider Global Assessment: 2 (mm) Swollen: 0 ; Tender: 0  Joint Exam   Not documented   There is currently no information documented on the homunculus. Go to the Rheumatology activity and complete the homunculus joint exam.  Investigation: No additional findings.  Imaging: No results found.  Recent Labs: Lab Results  Component Value Date   WBC 3.9 03/18/2018   HGB 15.3 03/18/2018   PLT 284 03/18/2018   NA 137 03/18/2018   K 5.0 03/18/2018   CL 101 03/18/2018   CO2 27 03/18/2018   GLUCOSE 97 03/18/2018   BUN 15 03/18/2018   CREATININE 1.11 03/18/2018   BILITOT 0.6 03/18/2018   ALKPHOS 61 03/19/2017   AST 25 03/18/2018   ALT 28 03/18/2018   PROT 7.7 03/18/2018   ALBUMIN 4.5 03/19/2017   CALCIUM 9.6 03/18/2018   GFRAA 89 03/18/2018   QFTBGOLDPLUS NEGATIVE 03/18/2018    Speciality Comments: No specialty comments available.  Procedures:  No procedures performed Allergies: Patient has no known allergies.   Assessment / Plan:     Visit Diagnoses: Psoriatic arthritis (HCC)-patient had no active synovitis on examination.  His symptoms are controlled on Cosentyx 300 mg subcu monthly.  Psoriasis-he has no active psoriasis lesions.  Left thumb pain-he appears to have some tendinitis in his left flexor tendons.  He has been rolling rugs at his work recently which I believe is contributing to the tendinitis.  I have advised him to use a wrist support.  I have also given  prescription for Mobic 15 mg p.o. daily.  Side effects were discussed at length.  If he has persistent symptoms he will notify me.  High risk medication use - Cosentyx 300 mg subcutaneous every month(Humira, stelara-failed). D/c MTX due to clinically doing well.  His labs are normal.  We will continue to monitor labs every 3 months.  Primary osteoarthritis of both hands-he has some chronic stiffness.  Primary osteoarthritis of both feet-is currently not having much discomfort.  DDD (degenerative disc disease), lumbar-he has intermittent lower back pain.  Alcohol use/ keep Methotrexate dose low  - He is not going to restart on MTX.    Orders: No orders of the defined types were placed in this encounter.  Meds ordered this encounter  Medications  . meloxicam (MOBIC) 15 MG tablet    Sig: Take 1 tablet (15 mg total) by mouth daily.    Dispense:  30 tablet    Refill:  0    Face-to-face time spent with  patient was 30 minutes. Greater than 50% of time was spent in counseling and coordination of care.  Follow-Up Instructions: Return in about 5 months (around 10/29/2018) for PsA, Ps, OA.   Bo Merino, MD  Note - This record has been created using Editor, commissioning.  Chart creation errors have been sought, but may not always  have been located. Such creation errors do not reflect on  the standard of medical care.

## 2018-05-30 ENCOUNTER — Ambulatory Visit (INDEPENDENT_AMBULATORY_CARE_PROVIDER_SITE_OTHER): Payer: BLUE CROSS/BLUE SHIELD | Admitting: Rheumatology

## 2018-05-30 ENCOUNTER — Encounter: Payer: Self-pay | Admitting: Rheumatology

## 2018-05-30 VITALS — BP 154/94 | HR 78 | Resp 15 | Ht 72.0 in | Wt 211.0 lb

## 2018-05-30 DIAGNOSIS — M19071 Primary osteoarthritis, right ankle and foot: Secondary | ICD-10-CM

## 2018-05-30 DIAGNOSIS — M19072 Primary osteoarthritis, left ankle and foot: Secondary | ICD-10-CM

## 2018-05-30 DIAGNOSIS — L4050 Arthropathic psoriasis, unspecified: Secondary | ICD-10-CM | POA: Diagnosis not present

## 2018-05-30 DIAGNOSIS — Z79899 Other long term (current) drug therapy: Secondary | ICD-10-CM | POA: Diagnosis not present

## 2018-05-30 DIAGNOSIS — Z789 Other specified health status: Secondary | ICD-10-CM

## 2018-05-30 DIAGNOSIS — L409 Psoriasis, unspecified: Secondary | ICD-10-CM | POA: Diagnosis not present

## 2018-05-30 DIAGNOSIS — M19042 Primary osteoarthritis, left hand: Secondary | ICD-10-CM

## 2018-05-30 DIAGNOSIS — M5136 Other intervertebral disc degeneration, lumbar region: Secondary | ICD-10-CM

## 2018-05-30 DIAGNOSIS — M19041 Primary osteoarthritis, right hand: Secondary | ICD-10-CM

## 2018-05-30 DIAGNOSIS — Z7289 Other problems related to lifestyle: Secondary | ICD-10-CM

## 2018-05-30 MED ORDER — MELOXICAM 15 MG PO TABS: 15.0000 mg | ORAL_TABLET | Freq: Every day | ORAL | 0 refills | Status: DC

## 2018-05-30 NOTE — Patient Instructions (Signed)
Standing Labs We placed an order today for your standing lab work.    Please come back and get your standing labs in November and every 3 months  We have open lab Monday through Friday from 8:30-11:30 AM and 1:30-4:00 PM  at the office of Dr. Tashina Credit.   You may experience shorter wait times on Monday and Friday afternoons. The office is located at [address], [address], [place], [place] [address] No appointment is necessary.   Labs are drawn by [facility].  You may receive a bill from [facility] for your lab work. If you have any questions regarding directions or hours of operation,  please call [phone].   Just as a reminder please drink plenty of water prior to coming for your lab work. Thanks!  

## 2018-06-05 ENCOUNTER — Other Ambulatory Visit: Payer: Self-pay | Admitting: Rheumatology

## 2018-06-05 NOTE — Telephone Encounter (Signed)
Last visit: 05/30/18 Next Visit: 08/05/18 Labs: 03/18/18 WNL TB gold: 03/18/18 Neg   Okay to refill per Dr. Estanislado Pandy

## 2018-06-27 ENCOUNTER — Other Ambulatory Visit: Payer: Self-pay | Admitting: Rheumatology

## 2018-06-30 NOTE — Progress Notes (Signed)
Office Visit Note  Patient: Mitchell Morris             Date of Birth: 1965-08-09           MRN: [id number]             PCP: Kathyrn Lass, MD Referring: Kathyrn Lass, MD Visit Date: 07/01/2018 Occupation: @GUAROCC @  Subjective:  Left knee pain   History of Present Illness: Mitchell Morris is a 52 y.o. male  with history of psoriatic arthritis, psoriasis and osteoarthritis.  He is on Cosentyx 300 mg sq once every 28 days.  He has had increased discomfort in multiple joints since discontinuing MTX in January 2019. He previously discontinued MTX due to clinically doing well. He was evaluated in our office on 05/30/18 pain.  He was started on meloxicam 15 mg 1 tablet by mouth daily which she did not feel improved his symptoms.  He states that the pain and swelling has resolved on its own.  He states for the past 3 weeks he has had left knee pain.  He states the pain is become severe.  He states that yesterday he had to leave work early due to not being able to bear weight on his left leg.  He has tried icing as well as elevating the left knee.  He denies any injuries.  He states that overall he has noticed increased discomfort since being off of methotrexate.  He states he continues to have chronic lower back pain will occasionally wear back brace which helps considerably.    Activities of Daily Living:  Patient reports morning stiffness for 0 minutes.   Patient Reports nocturnal pain.  Difficulty dressing/grooming: Denies Difficulty climbing stairs: Reports Difficulty getting out of chair: Reports Difficulty using hands for taps, buttons, cutlery, and/or writing: Reports  Review of Systems  Constitutional: Positive for fatigue. Negative for night sweats.  HENT: Negative for mouth sores, trouble swallowing, trouble swallowing, mouth dryness and nose dryness.   Eyes: Negative for redness, visual disturbance and dryness.  Respiratory: Negative for cough, hemoptysis, shortness of  breath and difficulty breathing.   Cardiovascular: Negative for chest pain, palpitations, hypertension, irregular heartbeat and swelling in legs/feet.  Gastrointestinal: Negative for blood in stool, constipation and diarrhea.  Endocrine: Negative for increased urination.  Genitourinary: Negative for painful urination.  Musculoskeletal: Positive for arthralgias, joint pain, joint swelling and morning stiffness. Negative for myalgias, muscle weakness, muscle tenderness and myalgias.  Skin: Positive for rash (Psoriasis). Negative for color change, hair loss, nodules/bumps, skin tightness, ulcers and sensitivity to sunlight.  Allergic/Immunologic: Negative for susceptible to infections.  Neurological: Negative for dizziness, fainting, memory loss, night sweats and weakness.  Hematological: Negative for swollen glands.  Psychiatric/Behavioral: Negative for depressed mood and sleep disturbance. The patient is not nervous/anxious.     PMFS History:  Patient Active Problem List   Diagnosis Date Noted  . Primary osteoarthritis of both hands 03/12/2017  . Primary osteoarthritis of both feet 03/12/2017  . DDD (degenerative disc disease), lumbar 03/12/2017  . Alcohol use/ keep Methotrexate dose low  03/12/2017  . Psoriatic arthritis ([place]) 09/27/2016  . Psoriasis 09/27/2016  . High risk medication use 09/27/2016    Past Medical History:  Diagnosis Date  . Psoriatic arthritis ([place])     Family History  Problem Relation Age of Onset  . Alzheimer's disease Mother   . Hypertension Father   . Cancer Brother        testicular   . Healthy Daughter   .  Healthy Son    Past Surgical History:  Procedure Laterality Date  . KNEE ARTHROPLASTY     Social History   Social History Narrative  . Not on file    Objective: Vital Signs: BP (!) 143/90 (BP Location: Left Arm, Patient Position: Sitting, Cuff Size: Large)   Pulse 88   Resp 14   Ht 6' (1.829 m)   Wt 208 lb (94.3 kg)   BMI 28.21 kg/m     Physical Exam  Constitutional: He is oriented to person, place, and time. He appears well-developed and well-nourished.  HENT:  Head: Normocephalic and atraumatic.  Eyes: Pupils are equal, round, and reactive to light. Conjunctivae and EOM are normal.  Neck: Normal range of motion. Neck supple.  Cardiovascular: Normal rate, regular rhythm and normal heart sounds.  Pulmonary/Chest: Effort normal and breath sounds normal.  Abdominal: Soft. Bowel sounds are normal.  Lymphadenopathy:    He has no cervical adenopathy.  Neurological: He is alert and oriented to person, place, and time.  Skin: Skin is warm and dry. Capillary refill takes less than 2 seconds.  psoriasis patches on lower extremities   Psychiatric: He has a normal mood and affect. His behavior is normal.  Nursing note and vitals reviewed.    Musculoskeletal Exam: C-spine, thoracic spine, and lumbar spine good ROM.  No midline spinal tenderness.  No SI joint tenderness.  Shoulder joints, elbow joints, wrist joints, MCPs, PIPs, and DIPs good ROM with no synovitis.  PIP and DIP synovial thickening. Hip joints, knee joints, ankle joints, MTPs, PIPs, and DIPs good with no synovitis.  Left knee moderate effusion.  No achilles tendonitis or plantar fasciitis.  No tenderness or swelling of ankle joints.   CDAI Exam: CDAI Score: 4.4  Patient Global Assessment: 7 (mm); Provider Global Assessment: 7 (mm) Swollen: 1 ; Tender: 2  Joint Exam      Right  Left  PIP 3   Tender     Knee     Swollen Tender     Investigation: No additional findings.  Imaging: No results found.  Recent Labs: Lab Results  Component Value Date   WBC 3.9 03/18/2018   HGB 15.3 03/18/2018   PLT 284 03/18/2018   NA 137 03/18/2018   K 5.0 03/18/2018   CL 101 03/18/2018   CO2 27 03/18/2018   GLUCOSE 97 03/18/2018   BUN 15 03/18/2018   CREATININE 1.11 03/18/2018   BILITOT 0.6 03/18/2018   ALKPHOS 61 03/19/2017   AST 25 03/18/2018   ALT 28  03/18/2018   PROT 7.7 03/18/2018   ALBUMIN 4.5 03/19/2017   CALCIUM 9.6 03/18/2018   GFRAA 89 03/18/2018   QFTBGOLDPLUS NEGATIVE 03/18/2018    Speciality Comments: No specialty comments available.  Procedures:  Large Joint Inj: L knee on 07/01/2018 1:18 PM Indications: pain Details: 27 G 1.5 in needle, medial approach  Arthrogram: No  Medications: 1.5 mL lidocaine 1 %; 60 mg triamcinolone acetonide 40 MG/ML Aspirate: 4 mL Outcome: tolerated well, no immediate complications  Aspirated 4 ml  Procedure, treatment alternatives, risks and benefits explained, specific risks discussed. Consent was given by the patient. Immediately prior to procedure a time out was called to verify the correct patient, procedure, equipment, support staff and site/side marked as required. Patient was prepped and draped in the usual sterile fashion.     Allergies: Patient has no known allergies.    Current regimen includes Cosentyx 300 mg subq every 28 days.  Last TB gold  negative on 03/18/18.  Most recent CBC/CMP within normal limits on 03/18/18.  Due for CBC/CMP today and then every 3 months.  Standing orders are in place. Recommend flu, Pneumovax 23, Prevnar 13, and Shingrix as indicated. Assessment / Plan:     Visit Diagnoses: Psoriatic arthritis [facility]): He has a moderate effusion of the left knee joint on exam.  A left knee aspiration was performed today.  4 mL were drawn off.  60 mg of Kenalog was injected into the left knee joint.  He tolerated the procedure well.   Procedure note completed above. He has been experiencing increased arthralgias since being off of MTX since January 2019.  He continues to inject Cosentx 300 mg subcutaneously once every 28 days.  He will restart on methotrexate 6 tablets by mouth once weekly and folic acid 2 mg by mouth daily.  Prescriptions for both methotrexate and folic acid were sent to the pharmacy.  A prednisone taper starting at 20 mg and tapering by 5 mg every 4 days was  sent to the pharmacy.  He will continue injecting Cosentyx 300 mg subcutaneously every 28 days.  He was advised to notify us if he continues to have joint pain and joint swelling.  He will follow-up in the office in 3 months.  Psoriasis: He has small patches of psoriasis on bilateral lower extremities.   High risk medication use - Cosentyx 300 mg subcutaneous every month.  He will be restarting MTX 6 tablets by mouth once weekly and folic acid 2 mg by mouth daily. He had lab work drawn today and he will return in 2 weeks, 1 month, then every 3 months to monitor for drug toxicity. (Humira, stelara-failed).  - Plan: CBC with Differential/Platelet, COMPLETE METABOLIC PANEL WITH GFR  Primary osteoarthritis of both hands: He has PIP and DIP synovial thickening consistent with osteoarthritis.  Complete fist formation bilaterally. Joint protection and muscle strengthening were discussed.   Primary osteoarthritis of both feet: He has osteoarthritic changes in both feet.  He wears proper fitting shoes.   DDD (degenerative disc disease), lumbar: Chronic pain.  He wears a back brace when he experiences increased discomfort.  His lower back has been causing increased discomfort the past several days which he attributes to gait change due to left knee pain.   Encounter for vitamin deficiency screening -We will check vitamin D today.  He is curious if he should restart taking a vitamin D supplement. Plan: VITAMIN D 25 Hydroxy (Vit-D Deficiency, Fractures)  Other fatigue -He has chronic fatigue.  He was previously taking a vitamin D supplement but discontinued.  We will check vitamin D level today.  Plan: VITAMIN D 25 Hydroxy (Vit-D Deficiency, Fractures)   Orders: Orders Placed This Encounter  Procedures  . Large Joint Inj  . CBC with Differential/Platelet  . COMPLETE METABOLIC PANEL WITH GFR  . VITAMIN D 25 Hydroxy (Vit-D Deficiency, Fractures)   Meds ordered this encounter  Medications  . methotrexate  (RHEUMATREX) 2.5 MG tablet    Sig: Take 6 tablets by mouth once weekly. Chemotherapy. Protect from light.    Dispense:  72 tablet    Refill:  0  . folic acid (FOLVITE) 1 MG tablet    Sig: Take 1 tablet (1 mg total) by mouth daily.    Dispense:  120 tablet    Refill:  2  . predniSONE (DELTASONE) 5 MG tablet    Sig: Take 4 tablets by mouth x4 days, 3 tablets by mouth x4  days, 2 tablets by mouth x4 days, 1 tablet by mouth x4 days    Dispense:  40 tablet    Refill:  0    Face-to-face time spent with patient was 30 minutes. Greater than 50% of time was spent in counseling and coordination of care.  Follow-Up Instructions: Return in about 3 months (around 09/30/2018) for Psoriatic arthritis, Osteoarthritis, DDD.   Ofilia Neas, PA-C   I examined and evaluated the patient with Hazel Sams PA.  Patient has been having a flare with increased pain and swelling in multiple joints.  He has been off methotrexate since January 2019.  He had warmth and swelling in his left knee joint on my examination with mild effusion.  Today after informed consent was obtained I left knee joint was aspirated and injected with cortisone as described above.  He tolerated the procedure well.  He was also given a prednisone taper.  We will restart him on methotrexate.  The plan of care was discussed as noted above.  Bo Merino, MD  Note - This record has been created using Editor, commissioning.  Chart creation errors have been sought, but may not always  have been located. Such creation errors do not reflect on  the standard of medical care.

## 2018-06-30 NOTE — Telephone Encounter (Signed)
Last visit: 05/30/18 Next Visit: 08/05/18 Labs: 03/18/18 WNL  Okay to refill per Dr. Estanislado Pandy

## 2018-07-01 ENCOUNTER — Encounter: Payer: Self-pay | Admitting: Physician Assistant

## 2018-07-01 ENCOUNTER — Ambulatory Visit: Payer: BLUE CROSS/BLUE SHIELD | Admitting: Rheumatology

## 2018-07-01 VITALS — BP 143/90 | HR 88 | Resp 14 | Ht 72.0 in | Wt 208.0 lb

## 2018-07-01 DIAGNOSIS — Z1321 Encounter for screening for nutritional disorder: Secondary | ICD-10-CM | POA: Diagnosis not present

## 2018-07-01 DIAGNOSIS — L409 Psoriasis, unspecified: Secondary | ICD-10-CM

## 2018-07-01 DIAGNOSIS — M19041 Primary osteoarthritis, right hand: Secondary | ICD-10-CM

## 2018-07-01 DIAGNOSIS — M25462 Effusion, left knee: Secondary | ICD-10-CM

## 2018-07-01 DIAGNOSIS — Z79899 Other long term (current) drug therapy: Secondary | ICD-10-CM

## 2018-07-01 DIAGNOSIS — M19042 Primary osteoarthritis, left hand: Secondary | ICD-10-CM

## 2018-07-01 DIAGNOSIS — R5383 Other fatigue: Secondary | ICD-10-CM | POA: Diagnosis not present

## 2018-07-01 DIAGNOSIS — M19071 Primary osteoarthritis, right ankle and foot: Secondary | ICD-10-CM

## 2018-07-01 DIAGNOSIS — M51369 Other intervertebral disc degeneration, lumbar region without mention of lumbar back pain or lower extremity pain: Secondary | ICD-10-CM

## 2018-07-01 DIAGNOSIS — M5136 Other intervertebral disc degeneration, lumbar region: Secondary | ICD-10-CM

## 2018-07-01 DIAGNOSIS — L4050 Arthropathic psoriasis, unspecified: Secondary | ICD-10-CM | POA: Diagnosis not present

## 2018-07-01 DIAGNOSIS — M19072 Primary osteoarthritis, left ankle and foot: Secondary | ICD-10-CM

## 2018-07-01 MED ORDER — LIDOCAINE HCL 1 % IJ SOLN
1.5000 mL | INTRAMUSCULAR | Status: AC | PRN
  Administered 2018-07-01: 1.5 mL

## 2018-07-01 MED ORDER — METHOTREXATE 2.5 MG PO TABS: ORAL_TABLET | ORAL | 0 refills | Status: DC

## 2018-07-01 MED ORDER — PREDNISONE 5 MG PO TABS
ORAL_TABLET | ORAL | 0 refills | Status: DC
Start: 2018-07-01 — End: 2018-08-05

## 2018-07-01 MED ORDER — TRIAMCINOLONE ACETONIDE 40 MG/ML IJ SUSP
60.0000 mg | INTRAMUSCULAR | Status: AC | PRN
  Administered 2018-07-01: 60 mg via INTRA_ARTICULAR

## 2018-07-01 MED ORDER — FOLIC ACID 1 MG PO TABS: 1.0000 mg | ORAL_TABLET | Freq: Every day | ORAL | 2 refills | Status: AC

## 2018-07-01 NOTE — Patient Instructions (Signed)
Standing Labs We placed an order today for your standing lab work.    Please come back and get your standing labs in 2 weeks, then 1 month, then every 3 months   We have open lab Monday through Friday from 8:30-11:30 AM and 1:30-4:00 PM  at the office of Dr. Bo Merino.   You may experience shorter wait times on Monday and Friday afternoons. The office is located at [address], [place], [place], [place] [address] No appointment is necessary.   Labs are drawn by [facility].  You may receive a bill from [place] for your lab work. If you have any questions regarding directions or hours of operation,  please call [phone].   Just as a reminder please drink plenty of water prior to coming for your lab work. Thanks!

## 2018-07-02 ENCOUNTER — Telehealth: Payer: Self-pay | Admitting: *Deleted

## 2018-07-02 DIAGNOSIS — E559 Vitamin D deficiency, unspecified: Secondary | ICD-10-CM

## 2018-07-02 LAB — CBC WITH DIFFERENTIAL/PLATELET
BASOS PCT: 0.5 %
Basophils Absolute: 33 cells/uL (ref 0–200)
EOS PCT: 0.3 %
Eosinophils Absolute: 20 cells/uL (ref 15–500)
HEMATOCRIT: 43.8 % (ref 38.5–50.0)
HEMOGLOBIN: 15.1 g/dL (ref 13.2–17.1)
LYMPHS ABS: 1544 {cells}/uL (ref 850–3900)
MCH: 28.3 pg (ref 27.0–33.0)
MCHC: 34.5 g/dL (ref 32.0–36.0)
MCV: 82.2 fL (ref 80.0–100.0)
MPV: 10.3 fL (ref 7.5–12.5)
Monocytes Relative: 10.6 %
NEUTROS ABS: 4303 {cells}/uL (ref 1500–7800)
Neutrophils Relative %: 65.2 %
Platelets: 290 10*3/uL (ref 140–400)
RBC: 5.33 10*6/uL (ref 4.20–5.80)
RDW: 12.8 % (ref 11.0–15.0)
Total Lymphocyte: 23.4 %
WBC: 6.6 10*3/uL (ref 3.8–10.8)
WBCMIX: 700 {cells}/uL (ref 200–950)

## 2018-07-02 LAB — COMPLETE METABOLIC PANEL WITH GFR
AG RATIO: 1.5 (calc) (ref 1.0–2.5)
ALKALINE PHOSPHATASE (APISO): 76 U/L (ref 40–115)
ALT: 27 U/L (ref 9–46)
AST: 21 U/L (ref 10–35)
Albumin: 4.5 g/dL (ref 3.6–5.1)
BILIRUBIN TOTAL: 0.5 mg/dL (ref 0.2–1.2)
BUN: 20 mg/dL (ref 7–25)
CHLORIDE: 102 mmol/L (ref 98–110)
CO2: 28 mmol/L (ref 20–32)
Calcium: 9.9 mg/dL (ref 8.6–10.3)
Creat: 1.24 mg/dL (ref 0.70–1.33)
GFR, Est African American: 77 mL/min/{1.73_m2} (ref >=60)
GFR, Est Non African American: 66 mL/min/{1.73_m2} (ref >=60)
GLUCOSE: 85 mg/dL (ref 65–99)
Globulin: 3.1 g/dL (calc) (ref 1.9–3.7)
POTASSIUM: 3.9 mmol/L (ref 3.5–5.3)
Sodium: 139 mmol/L (ref 135–146)
TOTAL PROTEIN: 7.6 g/dL (ref 6.1–8.1)

## 2018-07-02 LAB — VITAMIN D 25 HYDROXY (VIT D DEFICIENCY, FRACTURES): Vit D, 25-Hydroxy: 23 ng/mL — ABNORMAL LOW (ref 30–100)

## 2018-07-02 MED ORDER — VITAMIN D (ERGOCALCIFEROL) 1.25 MG (50000 UNIT) PO CAPS: 50000.0000 [IU] | ORAL_CAPSULE | ORAL | 0 refills | Status: DC

## 2018-07-02 NOTE — Telephone Encounter (Signed)
-----   Message from Ofilia Neas, PA-C sent at 07/02/2018  8:08 AM EST ----- CBC and CMP WNL. Vitamin D is low-23. Please send in vitamin D 50,000 units by mouth once a week for 3 months.  We will recheck vitamin D in 3 months.

## 2018-07-02 NOTE — Progress Notes (Signed)
CBC and CMP WNL. Vitamin D is low-23. Please send in vitamin D 50,000 units by mouth once a week for 3 months.  We will recheck vitamin D in 3 months.

## 2018-07-03 ENCOUNTER — Other Ambulatory Visit: Payer: Self-pay | Admitting: Rheumatology

## 2018-07-04 NOTE — Telephone Encounter (Signed)
Last Visit: 07/01/18 Next Visit: 08/05/18 Labs: 07/01/18 WNL TB Gold: 03/18/18 Neg   Okay to refill per Dr. Estanislado Pandy

## 2018-07-18 DIAGNOSIS — M25562 Pain in left knee: Secondary | ICD-10-CM | POA: Diagnosis not present

## 2018-07-25 NOTE — Progress Notes (Signed)
Office Visit Note  Patient: Mitchell Morris             Date of Birth: Mar 27, 1966           MRN: [id number]             PCP: Kathyrn Lass, MD Referring: Kathyrn Lass, MD Visit Date: 08/05/2018 Occupation: @GUAROCC @  Subjective:  Left knee joint pain   History of Present Illness: Mitchell Morris is a 52 y.o. male with history of psoriatic arthritis, osteoarthritis, and DDD.  He is on Cosentyx 300 mg sq injections every 28 days and he restarted on MTX 6 tablets by mouth once weekly and folic acid 2 mg po daily.  He states he continues to have left knee joint pain.  He states the pain is related to his level of activity.  He denies any joint swelling at this time.  He states the cortisone injection on 07/01/18 provided temporary relief.  He also completed the prednisone taper.  He followed up with Dr. Percell Miller about 2 weeks ago for further evaluation.  He reports a x-ray of the left knee joint was obtained and a MRI of the left knee was ordered.  He states he will be returning tomorrow to review the MRI with Dr. Percell Miller, and he will have the results sent to our office.  He denies any other joint pain or joint swelling at this time.  He denies any SI joint pain, achilles tendonitis, or plantar fasciitis.  He denies any active psoriasis.   Activities of Daily Living:  Patient reports morning stiffness for 0 none.   Patient Denies nocturnal pain.  Difficulty dressing/grooming: Denies Difficulty climbing stairs: Reports Difficulty getting out of chair: Denies Difficulty using hands for taps, buttons, cutlery, and/or writing: Denies  Review of Systems  Constitutional: Positive for fatigue. Negative for night sweats.  HENT: Negative for mouth sores, mouth dryness and nose dryness.   Eyes: Negative for photophobia, pain, redness, visual disturbance and dryness.  Respiratory: Negative for cough, hemoptysis, shortness of breath and difficulty breathing.   Cardiovascular: Negative for chest  pain, palpitations, hypertension, irregular heartbeat and swelling in legs/feet.  Gastrointestinal: Negative for blood in stool, constipation and diarrhea.  Endocrine: Negative for increased urination.  Genitourinary: Negative for difficulty urinating and painful urination.  Musculoskeletal: Positive for arthralgias, joint pain and joint swelling. Negative for myalgias, muscle weakness, morning stiffness, muscle tenderness and myalgias.  Skin: Negative for color change, rash, hair loss, nodules/bumps, skin tightness, ulcers and sensitivity to sunlight.  Allergic/Immunologic: Negative for susceptible to infections.  Neurological: Negative for dizziness, fainting, memory loss, night sweats and weakness.  Hematological: Negative for bruising/bleeding tendency and swollen glands.  Psychiatric/Behavioral: Negative for depressed mood and sleep disturbance. The patient is not nervous/anxious.     PMFS History:  Patient Active Problem List   Diagnosis Date Noted  . Primary osteoarthritis of both hands 03/12/2017  . Primary osteoarthritis of both feet 03/12/2017  . DDD (degenerative disc disease), lumbar 03/12/2017  . Alcohol use/ keep Methotrexate dose low  03/12/2017  . Psoriatic arthritis ([place]) 09/27/2016  . Psoriasis 09/27/2016  . High risk medication use 09/27/2016    Past Medical History:  Diagnosis Date  . Psoriatic arthritis ([place])     Family History  Problem Relation Age of Onset  . Alzheimer's disease Mother   . Hypertension Father   . Cancer Brother        testicular   . Healthy Daughter   . Healthy  Son    Past Surgical History:  Procedure Laterality Date  . KNEE ARTHROPLASTY     Social History   Social History Narrative  . Not on file    Objective: Vital Signs: BP 136/84 (BP Location: Left Arm, Patient Position: Sitting, Cuff Size: Normal)   Pulse 81   Resp 14   Ht 6' (1.829 m)   Wt 207 lb 9.6 oz (94.2 kg)   BMI 28.16 kg/m    Physical Exam Vitals signs and  nursing note reviewed.  Constitutional:      Appearance: He is well-developed.  HENT:     Head: Normocephalic and atraumatic.  Eyes:     Conjunctiva/sclera: Conjunctivae normal.     Pupils: Pupils are equal, round, and reactive to light.  Neck:     Musculoskeletal: Normal range of motion and neck supple.  Cardiovascular:     Rate and Rhythm: Normal rate and regular rhythm.     Heart sounds: Normal heart sounds.  Pulmonary:     Effort: Pulmonary effort is normal.     Breath sounds: Normal breath sounds.  Abdominal:     General: Bowel sounds are normal.     Palpations: Abdomen is soft.  Lymphadenopathy:     Cervical: No cervical adenopathy.  Skin:    General: Skin is warm and dry.     Capillary Refill: Capillary refill takes less than 2 seconds.  Neurological:     Mental Status: He is alert and oriented to person, place, and time.  Psychiatric:        Behavior: Behavior normal.      Musculoskeletal Exam: C-spine, thoracic spine, and lumbar spine good ROM.  No midline spinal tenderness.  No SI joint tenderness.  Shoulder joints, elbow joints, wrist joints, MCPs, PIPs, and DIPs good ROM with no synovitis.  PIP and DIP synovial thickening.  Complete fist formation bilaterally.  Hip joints good ROM with no discomfort. Good ROM of both knee joints with no discomfort. No warmth or effusion of knee joints. No baker's cyst palpable. No tenderness or swelling of ankle joints.  No achilles tendonitis or plantar fasciitis.   CDAI Exam: CDAI Score: Not documented Patient Global Assessment: Not documented; Provider Global Assessment: Not documented Swollen: Not documented; Tender: Not documented Joint Exam   Not documented   There is currently no information documented on the homunculus. Go to the Rheumatology activity and complete the homunculus joint exam.  Investigation: No additional findings.  Imaging: No results found.  Recent Labs: Lab Results  Component Value Date   WBC  6.6 07/01/2018   HGB 15.1 07/01/2018   PLT 290 07/01/2018   NA 139 07/01/2018   K 3.9 07/01/2018   CL 102 07/01/2018   CO2 28 07/01/2018   GLUCOSE 85 07/01/2018   BUN 20 07/01/2018   CREATININE 1.24 07/01/2018   BILITOT 0.5 07/01/2018   ALKPHOS 61 03/19/2017   AST 21 07/01/2018   ALT 27 07/01/2018   PROT 7.6 07/01/2018   ALBUMIN 4.5 03/19/2017   CALCIUM 9.9 07/01/2018   GFRAA 77 07/01/2018   QFTBGOLDPLUS NEGATIVE 03/18/2018    Speciality Comments: No specialty comments available.  Procedures:  No procedures performed Allergies: Patient has no known allergies.   Assessment / Plan:     Visit Diagnoses: Psoriatic arthritis ([place]): He has no synovitis or dactylitis on exam.  He continue to have intermittent left knee joint pain dependent on his level of activity.  He has good ROM on exam with  no warmth or effusion.  He received temporary relief following the left knee cortisone injection on 07/01/18 and the prednisone taper provided.  He followed up with Dr. Percell Miller who obtained a x-ray followed by a MRI of the left knee.  He will be following up with Dr. Percell Miller tomorrow to review MRI results.  He has no other joint pain or joint swelling at this time.  He has no achilles tendonitis or plantar fasciitis.  He has no SI joint tenderness on exam.  He has no active psoriasis.  His psoriatic arthritis seems to be well controlled on current treatment of Cosentyx 300 mg sq injections every 28 days and MTX 6 tablets by mouth once weekly.  He is due for his next dose of MTX today.  He has not missed any doses and is tolerating it well.  He does not need refills at this time.  He will continue on this current treatment regimen.  He is going to have Dr. Debroah Loop office forward [place] the MRI results of the left knee.  He was advised to notify us if develops increased joint pain or joint swelling.  He will follow up in 5 months.   Psoriasis: He has no active psoriasis at this time.   High risk medication  use - Cosentyx 300 mg sq injections every 28 days, restarted MTX 6 tablets po once weekly-Restarted 04/05/34, and folic acid 2 mg po daily.  Last TB gold negative on 03/18/2018.  Most recent CBC/CMP within normal limits on 07/01/2018 and will monitor every 3 months.  Standing orders are in place.  Alcohol use/ keep Methotrexate dose low: He is taking MTX 6 tablets by mouth once weekly.   Primary osteoarthritis of both hands: He has PIP and DIP synovial thickening.  He has no synovitis on exam.  He has complete fist formation bilaterally.  Joint protection and muscle strengthening were discussed.   Primary osteoarthritis of both feet: He has discomfort in his feet at this time.  He wears proper fitting shoes.   DDD (degenerative disc disease), lumbar: He has good ROM with no discomfort.  He has no midline spinal tenderness.    Orders: No orders of the defined types were placed in this encounter.  No orders of the defined types were placed in this encounter.     Follow-Up Instructions: Return in about 5 months (around 01/04/2019) for Psoriatic arthritis.   Ofilia Neas, PA-C   I examined and evaluated the patient with Hazel Sams PA.  Patient had good response to the cortisone injection done last visit.  Although he continues to have some discomfort in his left knee joint.  He states he was seen by Dr. Percell Miller who has a schedule MRI of his left knee joint to rule out meniscal tear injury.  He had no warmth swelling or effusion on my examination today.  None of his joints are inflamed.  I believe he is doing much better on Cosentyx and methotrexate combination which we will continue at this point.  We also discussed and future Visco supplement injections.  The plan of care was discussed as noted above.  Bo Merino, MD  Note - This record has been created using Editor, commissioning.  Chart creation errors have been sought, but may not always  have been located. Such creation errors do not  reflect on  the standard of medical care.

## 2018-07-28 DIAGNOSIS — M25562 Pain in left knee: Secondary | ICD-10-CM | POA: Diagnosis not present

## 2018-08-05 ENCOUNTER — Encounter: Payer: Self-pay | Admitting: Physician Assistant

## 2018-08-05 ENCOUNTER — Ambulatory Visit: Payer: BLUE CROSS/BLUE SHIELD | Admitting: Rheumatology

## 2018-08-05 VITALS — BP 136/84 | HR 81 | Resp 14 | Ht 72.0 in | Wt 207.6 lb

## 2018-08-05 DIAGNOSIS — L409 Psoriasis, unspecified: Secondary | ICD-10-CM | POA: Diagnosis not present

## 2018-08-05 DIAGNOSIS — Z79899 Other long term (current) drug therapy: Secondary | ICD-10-CM | POA: Diagnosis not present

## 2018-08-05 DIAGNOSIS — M5136 Other intervertebral disc degeneration, lumbar region: Secondary | ICD-10-CM

## 2018-08-05 DIAGNOSIS — M19041 Primary osteoarthritis, right hand: Secondary | ICD-10-CM

## 2018-08-05 DIAGNOSIS — L4050 Arthropathic psoriasis, unspecified: Secondary | ICD-10-CM | POA: Diagnosis not present

## 2018-08-05 DIAGNOSIS — Z789 Other specified health status: Secondary | ICD-10-CM

## 2018-08-05 DIAGNOSIS — Z7289 Other problems related to lifestyle: Secondary | ICD-10-CM | POA: Diagnosis not present

## 2018-08-05 DIAGNOSIS — M19071 Primary osteoarthritis, right ankle and foot: Secondary | ICD-10-CM

## 2018-08-05 DIAGNOSIS — M19042 Primary osteoarthritis, left hand: Secondary | ICD-10-CM

## 2018-08-05 DIAGNOSIS — M19072 Primary osteoarthritis, left ankle and foot: Secondary | ICD-10-CM

## 2018-08-05 NOTE — Patient Instructions (Signed)
Standing Labs We placed an order today for your standing lab work.    Please come back and get your standing labs in March and every 3 months  We have open lab Monday through Friday from 8:30-11:30 AM and 1:30-4:00 PM  at the office of Dr. Shaili Deveshwar.   You may experience shorter wait times on Monday and Friday afternoons. The office is located at [address], [address], [place],  [address] No appointment is necessary.   Labs are drawn by [facility].  You may receive a bill from [facility] for your lab work.  If you wish to have your labs drawn at another location, please call the office 24 hours in advance to send orders.  If you have any questions regarding directions or hours of operation,  please call [phone].   Just as a reminder please drink plenty of water prior to coming for your lab work. Thanks!  

## 2018-08-06 ENCOUNTER — Telehealth: Payer: Self-pay | Admitting: Rheumatology

## 2018-08-06 DIAGNOSIS — M25562 Pain in left knee: Secondary | ICD-10-CM | POA: Diagnosis not present

## 2018-08-06 NOTE — Telephone Encounter (Signed)
Patient stopped by to ask for information about MTX to be sent to [facility]. Claiborne [facility] surgery scheduler. Patient going to have knee scope for TMM. Doctor needs to know if he can stop MTX, when to stop, and how long to be off before and after surgery. Patient due for next dose tomorrow(?). Please call to advise patient also.

## 2018-08-06 NOTE — Telephone Encounter (Signed)
He should stop methotrexate 1 week prior to the surgery and then restart a week later if he has clearance from Dr. Percell Miller.

## 2018-08-06 NOTE — Telephone Encounter (Signed)
Received clearance form for Dr. Debroah Loop office for surgery. Per Dr. Estanislado Pandy patient is to stop the Cosentyx 1 month prior to surgery and the MTX 1 weeks prior to surgery, May resume both meds 2 weeks after surgery if no infection. Faxed form to Dr. Debroah Loop office and left message to advise patient.

## 2018-08-14 DIAGNOSIS — D2272 Melanocytic nevi of left lower limb, including hip: Secondary | ICD-10-CM | POA: Diagnosis not present

## 2018-08-14 DIAGNOSIS — Z86018 Personal history of other benign neoplasm: Secondary | ICD-10-CM | POA: Diagnosis not present

## 2018-08-14 DIAGNOSIS — D2271 Melanocytic nevi of right lower limb, including hip: Secondary | ICD-10-CM | POA: Diagnosis not present

## 2018-08-14 DIAGNOSIS — D2221 Melanocytic nevi of right ear and external auricular canal: Secondary | ICD-10-CM | POA: Diagnosis not present

## 2018-09-20 ENCOUNTER — Other Ambulatory Visit: Payer: Self-pay | Admitting: Physician Assistant

## 2018-09-22 ENCOUNTER — Other Ambulatory Visit: Payer: Self-pay | Admitting: Rheumatology

## 2018-09-22 NOTE — Telephone Encounter (Signed)
Last Visit: 08/06/18 Next Visit: 02/17/19 Labs: 07/02/19 WNL  Okay to refill per Dr. Estanislado Pandy

## 2018-09-22 NOTE — Telephone Encounter (Signed)
Left message to advise patient will need vitamin D rechecked before refilling.

## 2018-10-23 ENCOUNTER — Telehealth: Payer: Self-pay | Admitting: Rheumatology

## 2018-10-23 NOTE — Telephone Encounter (Signed)
[facility] called stating prescription refill for patient's Cosentyx was faxed yesterday 10/22/18.   Requested Dr. Estanislado Pandy complete form and fax back or call with verbal authorization to patient's plan.

## 2018-10-23 NOTE — Telephone Encounter (Signed)
PA completed via Cover My Meds.

## 2018-10-29 ENCOUNTER — Telehealth: Payer: Self-pay

## 2018-10-29 MED ORDER — SECUKINUMAB (300 MG DOSE) 150 MG/ML ~~LOC~~ SOAJ: 300.0000 mg | SUBCUTANEOUS | 0 refills | Status: DC

## 2018-10-29 NOTE — Telephone Encounter (Signed)
Refill request received via fax from [facility].   Last Visit: 08/05/2018 Next Visit: 02/17/2019 Labs: 07/01/2018 CBC and CMP WNL. TB Gold: 03/18/2018 neg  Attempted to contact patient and left message on machine to advise patient he is due for labs. Advised patient he will have to go to [place] on [facility]. Since we are currently not drawing labs in the office.   Okay to refill per Dr. Estanislado Pandy.

## 2018-11-03 IMAGING — MR MR LUMBAR SPINE W/O CM
5 series · 43 of 48 positions shown · non-contrast
Comparison: [facility] Lumbar radiographs 10/01/2016

CLINICAL DATA: 50-year-old male with lumbar back pain. Numbness and
burning radiating to the left groin and scrotum for several weeks
with no known injury.

EXAM:
MRI LUMBAR SPINE WITHOUT CONTRAST
TECHNIQUE: Multiplanar, multisequence MR imaging of the lumbar spine was
performed. No intravenous contrast was administered.

[Series 3: T2 · sagittal · 4.0mm · 0.88mm/px · 6 of 13 slices shown (1 of 2)]
[im 1/13]
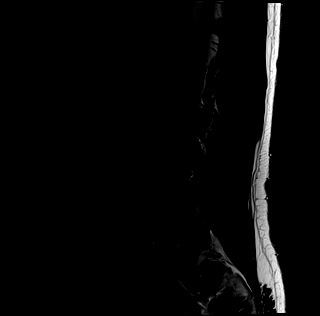
[im 3/13]
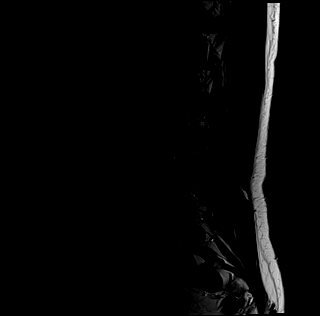
[im 5/13]
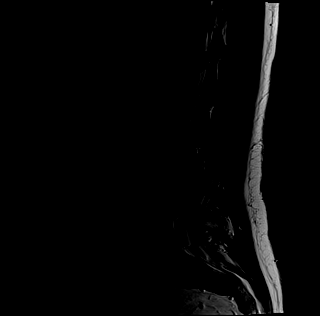
[im 8/13]
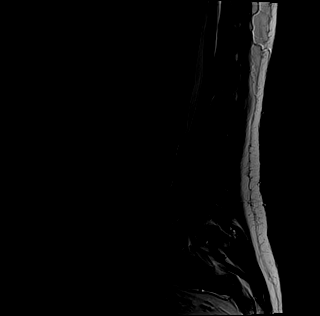
[im 10/13]
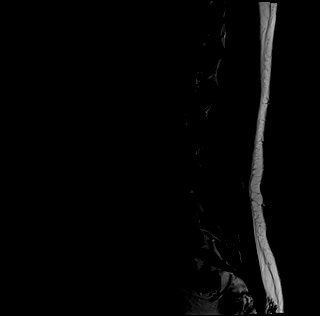
[im 13/13]
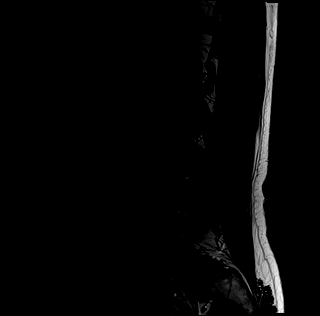

[Series 4: T1 · sagittal · 4.0mm · 0.88mm/px · 5 of 13 slices shown (1 of 2)]
[im 1/13]
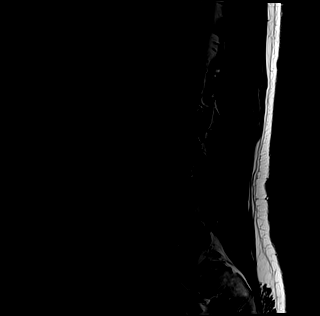
[im 4/13]
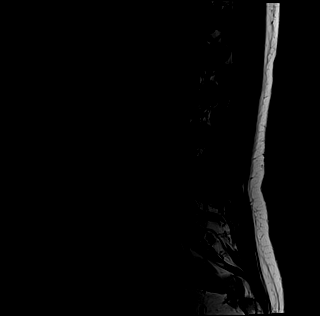
[im 7/13]
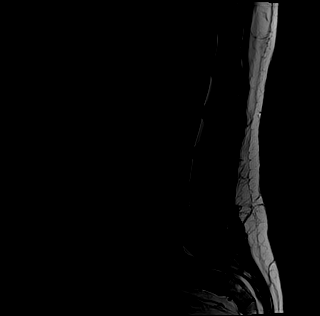
[im 10/13]
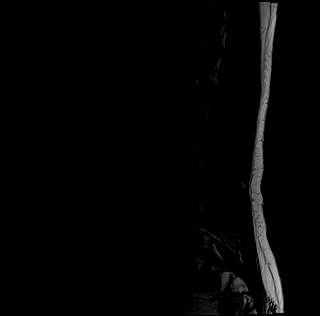
[im 13/13]
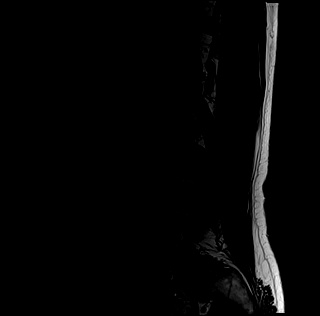

[Series 5: T2 · axial · 4.0mm · 0.74mm/px · z∈[-110,+85]mm · 16 of 39 slices shown (2 of 2)]
[im 1/39]
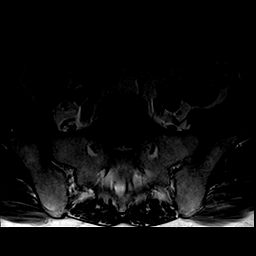
[im 3/39]
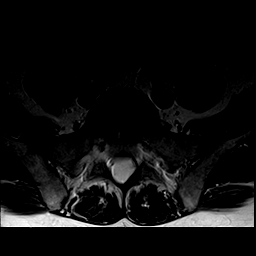
[im 6/39]
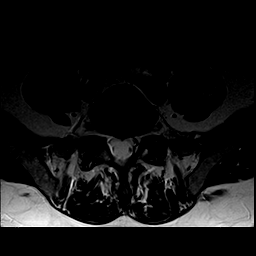
[im 8/39]
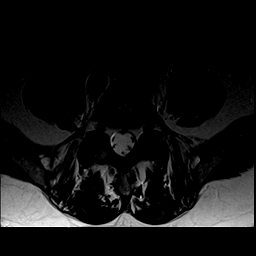
[im 11/39]
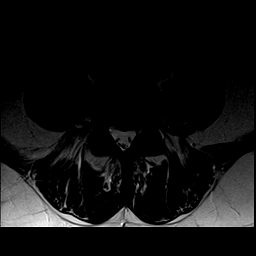
[im 13/39]
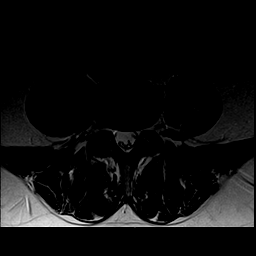
[im 16/39]
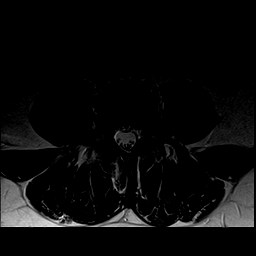
[im 18/39]
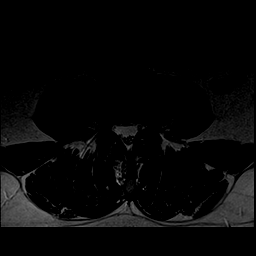
[im 21/39]
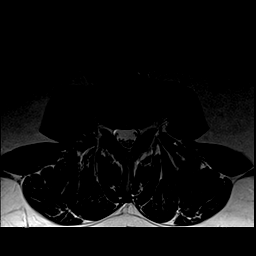
[im 23/39]
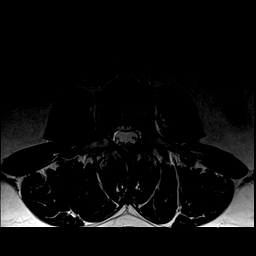
[im 26/39]
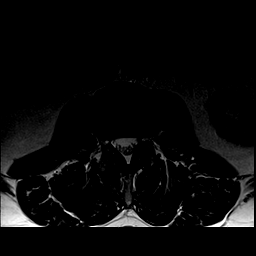
[im 28/39]
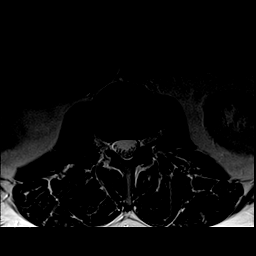
[im 31/39]
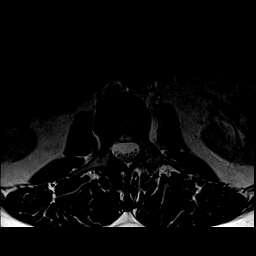
[im 33/39]
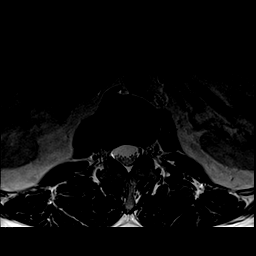
[im 36/39]
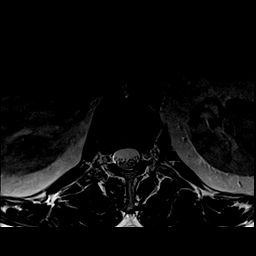
[im 39/39]
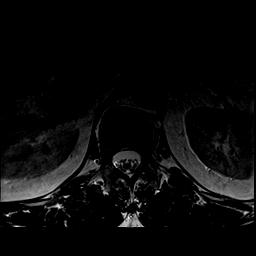

[Series 6: ir sag · sagittal · 4.0mm · 0.55mm/px · 5 of 13 slices shown]
[im 1/13]
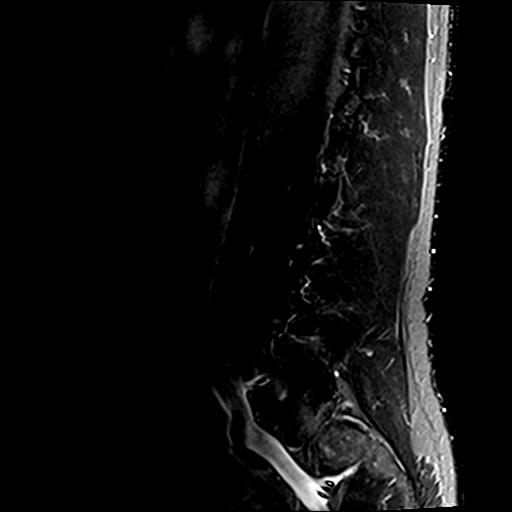
[im 4/13]
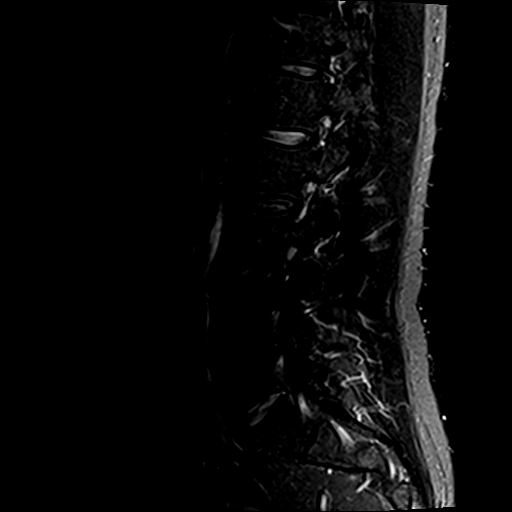
[im 7/13]
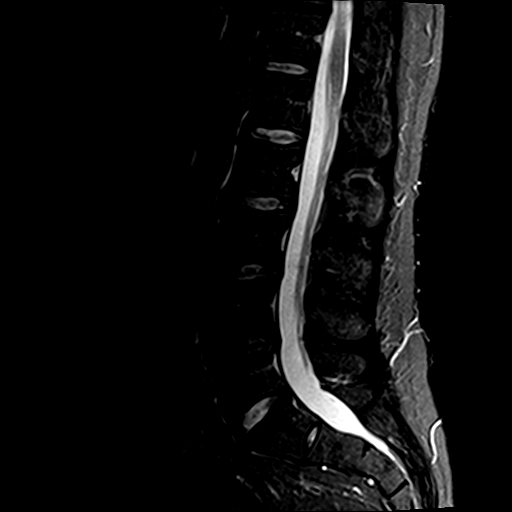
[im 10/13]
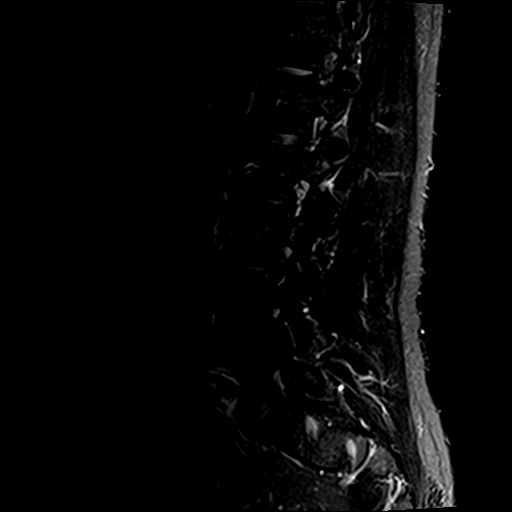
[im 13/13]
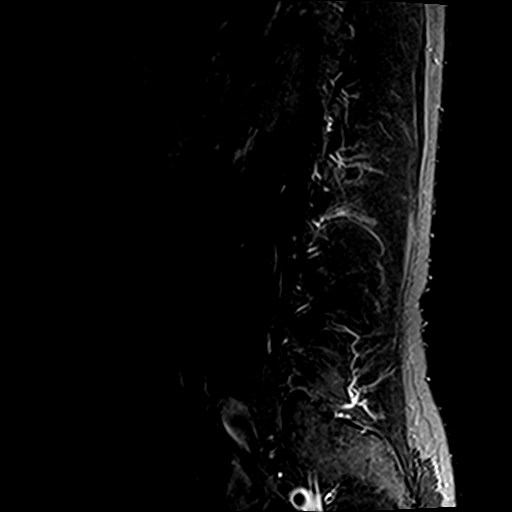

[Series 7: T1 · axial · 4.0mm · 0.74mm/px · z∈[-110,+85]mm · 11 of 39 slices shown (2 of 2)]
[im 1/39]
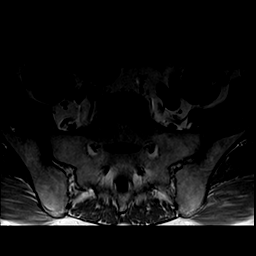
[im 3/39]
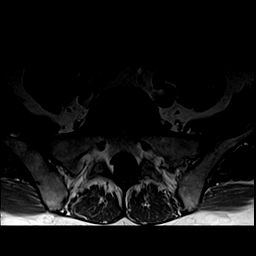
[im 6/39]
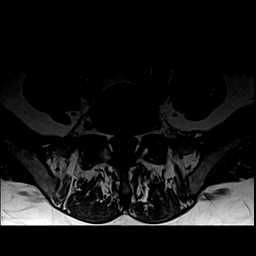
[im 8/39]
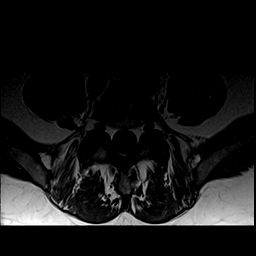
[im 13/39]
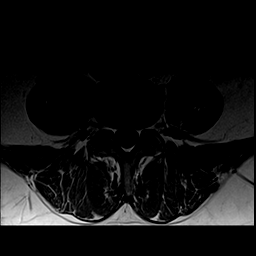
[im 18/39]
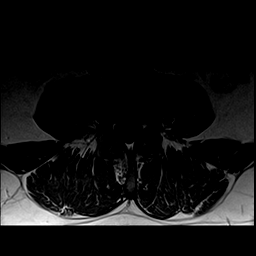
[im 21/39]
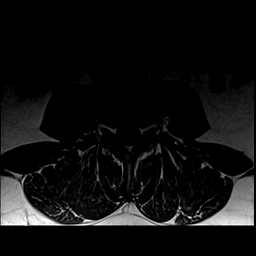
[im 23/39]
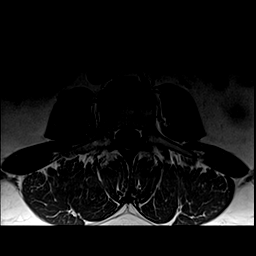
[im 28/39]
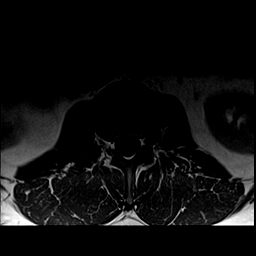
[im 33/39]
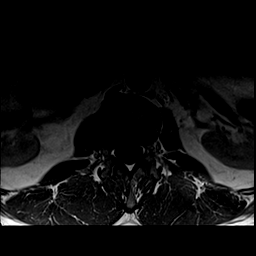
[im 39/39]
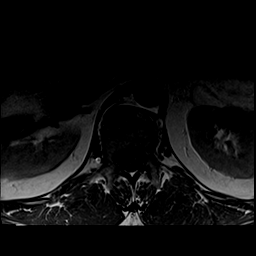

[43 of 48 positions shown; findings below may reference images not displayed]

FINDINGS: Segmentation:  Normal as seen on the comparison radiographs.

Alignment: Stable with relatively preserved lumbar lordosis. There
is stable mild retrolisthesis L3-L4 through L5-S1.

Vertebrae: No marrow edema or evidence of acute osseous abnormality.
Visualized bone marrow signal is within normal limits. Negative
visualized sacrum and SI joints.

Conus medullaris: Extends to the L1 level and appears normal.

Paraspinal and other soft tissues: Negative.

Disc levels:

T11-T12: Partially visible disc desiccation and circumferential disc
bulge plus facet hypertrophy. No definite spinal stenosis.

T12-L1:  Negative.

L1-L2:  Negative.

L2-L3: Left far lateral broad-based disc protrusion with annular
fissure (series 5, image 13). Superimposed disc extrusion with 12 mm
sequestered disc fragment into the left L2 neural foramen (with a
double density: disc + nerve root - series 4, image 10). No left
lateral recess involvement. Mild facet hypertrophy at this level. No
spinal or right foraminal stenosis.

L3-L4: Circumferential but mostly left far lateral disc bulge. Mild
to moderate broad-based component of disc in the left L3 neural
foramen with mild endplate spurring. Mild to moderate left L3
foraminal stenosis. Mild facet hypertrophy. No convincing lateral
recess stenosis. No spinal or right foraminal stenosis.

L4-L5: Disc desiccation and disc space loss. Circumferential disc
bulge with superimposed broad-based central and slightly caudal disc
protrusion (series 5, image 29). Mild facet and left ligament flavum
hypertrophy. Disc material in proximity to the descending L5 nerve
roots in the lateral recesses, more so the left (same image). No
spinal or foraminal stenosis.

L5-S1: Negative disc. Mild facet and endplate spurring. Mild left L5
foraminal stenosis.
IMPRESSION: 1. The most symptomatic level is felt to be L2-L3. There is a left
far lateral broad-based disc herniation with superimposed left
foraminal disc extrusion and 12 mm sequestered disc fragment
affecting the exiting left L2 nerve. Query corresponding
radiculitis.
2. Additionally, there is leftward disc degeneration at L3-L4 which
might affect the exiting left L3 nerve, and a small central disc
herniation at L4-L5 which might affect the descending left L5 nerve
roots in that lateral recess.
3. No lumbar spinal stenosis.  No acute osseous abnormality.

## 2018-12-31 ENCOUNTER — Other Ambulatory Visit: Payer: Self-pay | Admitting: Physician Assistant

## 2018-12-31 NOTE — Telephone Encounter (Signed)
Last Visit: 08/06/18 Next Visit: 02/17/19 Labs: 07/02/19 WNL  Attempted to contact the patient and left message to advise patient he is due for labs.  Okay to refill 30 day supply MTX?

## 2018-12-31 NOTE — Telephone Encounter (Signed)
Patient needs labs prior to the refill on methotrexate.

## 2019-01-12 ENCOUNTER — Other Ambulatory Visit: Payer: Self-pay | Admitting: Rheumatology

## 2019-01-19 ENCOUNTER — Other Ambulatory Visit: Payer: Self-pay

## 2019-01-19 DIAGNOSIS — Z79899 Other long term (current) drug therapy: Secondary | ICD-10-CM

## 2019-01-19 DIAGNOSIS — E559 Vitamin D deficiency, unspecified: Secondary | ICD-10-CM

## 2019-01-20 LAB — COMPLETE METABOLIC PANEL WITH GFR
AG Ratio: 1.6 (calc) (ref 1.0–2.5)
ALT: 30 U/L (ref 9–46)
AST: 21 U/L (ref 10–35)
Albumin: 4.5 g/dL (ref 3.6–5.1)
Alkaline phosphatase (APISO): 74 U/L (ref 35–144)
BUN: 15 mg/dL (ref 7–25)
CO2: 28 mmol/L (ref 20–32)
Calcium: 9.7 mg/dL (ref 8.6–10.3)
Chloride: 101 mmol/L (ref 98–110)
Creat: 1.15 mg/dL (ref 0.70–1.33)
GFR, Est African American: 84 mL/min/{1.73_m2} (ref >=60)
GFR, Est Non African American: 73 mL/min/{1.73_m2} (ref >=60)
Globulin: 2.8 g/dL (calc) (ref 1.9–3.7)
Glucose, Bld: 139 mg/dL — ABNORMAL HIGH (ref 65–99)
Potassium: 4.3 mmol/L (ref 3.5–5.3)
Sodium: 139 mmol/L (ref 135–146)
Total Bilirubin: 0.5 mg/dL (ref 0.2–1.2)
Total Protein: 7.3 g/dL (ref 6.1–8.1)

## 2019-01-20 LAB — CBC WITH DIFFERENTIAL/PLATELET
Absolute Monocytes: 343 cells/uL (ref 200–950)
Basophils Absolute: 31 cells/uL (ref 0–200)
Basophils Relative: 0.6 %
Eosinophils Absolute: 21 cells/uL (ref 15–500)
Eosinophils Relative: 0.4 %
HCT: 44.7 % (ref 38.5–50.0)
Hemoglobin: 15.2 g/dL (ref 13.2–17.1)
Lymphs Abs: 1347 cells/uL (ref 850–3900)
MCH: 29.2 pg (ref 27.0–33.0)
MCHC: 34 g/dL (ref 32.0–36.0)
MCV: 85.8 fL (ref 80.0–100.0)
MPV: 9.9 fL (ref 7.5–12.5)
Monocytes Relative: 6.6 %
Neutro Abs: 3458 cells/uL (ref 1500–7800)
Neutrophils Relative %: 66.5 %
Platelets: 254 10*3/uL (ref 140–400)
RBC: 5.21 10*6/uL (ref 4.20–5.80)
RDW: 13 % (ref 11.0–15.0)
Total Lymphocyte: 25.9 %
WBC: 5.2 10*3/uL (ref 3.8–10.8)

## 2019-01-20 LAB — VITAMIN D 25 HYDROXY (VIT D DEFICIENCY, FRACTURES): Vit D, 25-Hydroxy: 28 ng/mL — ABNORMAL LOW (ref 30–100)

## 2019-01-20 NOTE — Progress Notes (Signed)
Glucose is mildly elevated.

## 2019-01-22 ENCOUNTER — Telehealth: Payer: Self-pay | Admitting: *Deleted

## 2019-01-22 DIAGNOSIS — E559 Vitamin D deficiency, unspecified: Secondary | ICD-10-CM

## 2019-01-22 MED ORDER — VITAMIN D (ERGOCALCIFEROL) 1.25 MG (50000 UNIT) PO CAPS: 50000.0000 [IU] | ORAL_CAPSULE | ORAL | 0 refills | Status: DC

## 2019-01-22 NOTE — Telephone Encounter (Signed)
-----   Message from Ofilia Neas, PA-C sent at 01/20/2019  4:31 PM EDT ----- Vitamin D is low-28 but improved from 23 6 months ago.  Please notify patient and send in vitamin D 50,000 units by mouth once weekly for 3 months.  We will recheck in 3 months.

## 2019-01-28 ENCOUNTER — Telehealth: Payer: Self-pay | Admitting: Rheumatology

## 2019-01-28 MED ORDER — COSENTYX SENSOREADY (300 MG) 150 MG/ML ~~LOC~~ SOAJ: 300.0000 mg | SUBCUTANEOUS | 0 refills | Status: DC

## 2019-01-28 NOTE — Telephone Encounter (Signed)
Last Visit: 08/06/18 Next Visit: 02/17/19 Labs: 01/19/19 Glucose is mildly elevated. TB Gold: 03/18/18 Neg   Okay to refill per Dr. Estanislado Pandy

## 2019-01-28 NOTE — Telephone Encounter (Signed)
Kim from [facility] left a voicemail requesting prescription refill of Cosentyx  for patient.  Please fax prescription to [phone].  Mitchell Morris states if you have any questions, she can be reached at [phone] (confidential line)

## 2019-02-04 NOTE — Progress Notes (Signed)
Office Visit Note  Patient: Mitchell Morris             Date of Birth: 02-01-1966           MRN: [id number]             PCP: Kathyrn Lass, MD Referring: Kathyrn Lass, MD Visit Date: 02/17/2019 Occupation: @GUAROCC @  Subjective:  Pain in both hands  History of Present Illness: Mitchell Morris is a 53 y.o. male with history of psoriatic arthritis, osteoarthritis, and DDD.He is on cosentyx 300 mg sq injections every 28 days, Methotrexate 6 tablets po once weekly and folic acid 1 mg po daily.    He is having pain in both hands.  He is experiencing paresthesias in both hands, worse in the left hand.  He states his symptoms started 6 weeks ago.  He experiences nocturnal pain and numbness.  He has tried icing and using night splints.  He denies any overuse activities.  He has noticed some swelling in both hands and both feet.   He states about 1 week before his next cosentyx injection his pain is worse.  He has arthroscopic surgery in the left knee joint for a meniscal tear in February 2020.  He states it is not recovering as quickly as the right knee did about 5 years ago.    Activities of Daily Living:  Patient reports morning stiffness for several hours.   Patient Reports nocturnal pain.  Difficulty dressing/grooming: Denies Difficulty climbing stairs: Denies Difficulty getting out of chair: Denies Difficulty using hands for taps, buttons, cutlery, and/or writing: Denies  Review of Systems  Constitutional: Negative for fatigue and night sweats.  HENT: Negative for mouth sores, mouth dryness and nose dryness.   Eyes: Negative for pain, redness, itching and dryness.  Respiratory: Negative for cough, hemoptysis, shortness of breath, wheezing and difficulty breathing.   Cardiovascular: Negative for chest pain, palpitations, hypertension, irregular heartbeat and swelling in legs/feet.  Gastrointestinal: Negative for blood in stool, constipation and diarrhea.  Endocrine: Negative for  increased urination.  Genitourinary: Negative for painful urination and pelvic pain.  Musculoskeletal: Positive for arthralgias, joint pain, joint swelling and morning stiffness. Negative for myalgias, muscle weakness, muscle tenderness and myalgias.  Skin: Negative for color change, rash, hair loss, nodules/bumps, redness, skin tightness, ulcers and sensitivity to sunlight.  Allergic/Immunologic: Negative for susceptible to infections.  Neurological: Negative for dizziness, fainting, headaches, memory loss and night sweats.  Hematological: Negative for swollen glands.  Psychiatric/Behavioral: Negative for depressed mood, confusion and sleep disturbance. The patient is not nervous/anxious.     PMFS History:  Patient Active Problem List   Diagnosis Date Noted  . Primary osteoarthritis of both hands 03/12/2017  . Primary osteoarthritis of both feet 03/12/2017  . DDD (degenerative disc disease), lumbar 03/12/2017  . Alcohol use/ keep Methotrexate dose low  03/12/2017  . Psoriatic arthritis ([place]) 09/27/2016  . Psoriasis 09/27/2016  . High risk medication use 09/27/2016    Past Medical History:  Diagnosis Date  . Psoriatic arthritis ([place])     Family History  Problem Relation Age of Onset  . Alzheimer's disease Mother   . Hypertension Father   . Cancer Brother        testicular   . Healthy Daughter   . Healthy Son    Past Surgical History:  Procedure Laterality Date  . KNEE ARTHROPLASTY     Social History   Social History Narrative  . Not on file  There is no immunization history on file for this patient.   Objective: Vital Signs: BP (!) 150/98 (BP Location: Left Arm, Patient Position: Sitting, Cuff Size: Normal)   Pulse 75   Resp 14   Ht 6' (1.829 m)   Wt 204 lb 9.6 oz (92.8 kg)   BMI 27.75 kg/m    Physical Exam Vitals signs and nursing note reviewed.  Constitutional:      Appearance: He is well-developed.  HENT:     Head: Normocephalic and atraumatic.  Eyes:      Conjunctiva/sclera: Conjunctivae normal.     Pupils: Pupils are equal, round, and reactive to light.  Neck:     Musculoskeletal: Normal range of motion and neck supple.  Cardiovascular:     Rate and Rhythm: Normal rate and regular rhythm.     Heart sounds: Normal heart sounds.  Pulmonary:     Effort: Pulmonary effort is normal.     Breath sounds: Normal breath sounds.  Abdominal:     General: Bowel sounds are normal.     Palpations: Abdomen is soft.  Skin:    General: Skin is warm and dry.     Capillary Refill: Capillary refill takes less than 2 seconds.  Neurological:     Mental Status: He is alert and oriented to person, place, and time.  Psychiatric:        Behavior: Behavior normal.      Musculoskeletal Exam: C-spine, thoracic spine, and lumbar spine good ROM.  Shoulder joints, elbow joints, wrist joints have good ROM with no synovitis.  No tenderness or synovitis of MCP joints.  PIP and DIP synovial thickening but no synovitis. Incomplete fist formation of the right hand.  Hip joints, knee joints, ankle joints, MTPs, PIPs, and DIPs good ROM with no synovitis. 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 No warmth or effusion of knee joints.  No tenderness of swelling of ankle joints.   CDAI Exam: CDAI Score: 1.2  Patient Global: 6 mm; Provider Global: 6 mm Swollen: 2 ; Tender: 2  Joint Exam      Right  Left  PIP 3 (toe)  Swollen Tender     DIP 3 (toe)  Swollen Tender        Investigation: No additional findings.  Imaging: No results found.  Recent Labs: Lab Results  Component Value Date   WBC 5.2 01/19/2019   HGB 15.2 01/19/2019   PLT 254 01/19/2019   NA 139 01/19/2019   K 4.3 01/19/2019   CL 101 01/19/2019   CO2 28 01/19/2019   GLUCOSE 139 (H) 01/19/2019   BUN 15 01/19/2019   CREATININE 1.15 01/19/2019   BILITOT 0.5 01/19/2019   ALKPHOS 61 03/19/2017   AST 21 01/19/2019   ALT 30 01/19/2019   PROT 7.3 01/19/2019   ALBUMIN 4.5 03/19/2017   CALCIUM 9.7 01/19/2019    GFRAA 84 01/19/2019   QFTBGOLDPLUS NEGATIVE 03/18/2018    Speciality Comments: No specialty comments available.  Procedures:  No procedures performed Allergies: Patient has no known allergies.        Assessment / Plan:     Visit Diagnoses: Psoriatic arthritis ([place]) - He has dactylitis of the right 3rd toe.  He has synovial thickening of PIP and DIP joints in both hands but no active synovitis or tenderness on exam.  He has been experiencing increased pain in both hands and both feet. For the past 6 weeks, he has been experiencing paresthesia in both hands.  He experiences nocturnal pain  and numbness in both hands, and he has tried wearing night splints and applying ice.  We will refer him for nerve conduction studies.  He has no achilles tendonitis or plantar fascitis.  No SI joint tenderness on exam.  He has no active psoriasis.  He is on Cosentyx 300 mg sq every 28 days, MTX 6 tablets po once weekly, and folic acid 1 mg po daily. He occasionally misses 1-2 doses of MTX per month.  We discussed spacing his Cosentyx injections to 150 mg every 14 days and to stay compliant with taking MTX 6 tablets every week.  We will obtain x-rays of both hands and both feet today.  He declined a prednisone taper.  He was advised to notify us if he develops increased joint pain or joint swelling.  He will follow up in 3 months.   Psoriasis -He has no psoriasis at this time.   High risk medication use -Cosentyx 300 mg every 28 days, methotrexate 2.5 mg 6 tablets every 7 days, and folic acid 1 mg 1 tablet daily.  Last TB gold negative on 03/18/2018 and will monitor yearly. Future order was placed today. Most recent CBC/CMP within normal limits on 01/19/2019.  Due for CBC/CMP in September will monitor every 3 months.  Standing orders are in place. - Plan: QuantiFERON-TB Gold Plus  Paresthesia of both hands - He has been experiencing paresthesia of both hands intermittently for the past 6 weeks.  He has been  experiencing nocturnal pain and numbness.  He has tried wearing night splints and icing his wrists, without much relief.  We will refer him to Dr. Ernestina Patches for nerve conduction studies. Plan: Ambulatory referral to Physical Medicine Rehab  Alcohol use/ keep Methotrexate dose low  - He will continue taking MTX 6 tablets po once weekly.   Primary osteoarthritis of both hands - He has PIP and DIP synovial thickening but no synovitis.  He has incomplete fist formation of the right hand.  Joint protection and muscle strengthening were discussed.   Primary osteoarthritis of both feet - He has PIP and DIP synovial thickening.  He has been experiencing increased pain in both feet.   DDD (degenerative disc disease), lumbar - He has good ROM with no discomfort.  He has no midline spinal tenderness.    Pain in both hands - She has been experiencing increased pain in both hands.  X-rays of both hands were obtained today. Plan: XR Hand 2 View Left, XR Hand 2 View Right  Pain in both feet - She has been having increased pain in both feet.  X-rays of both feet were obtained today. Plan: XR Foot 2 Views Left, XR Foot 2 Views Right  Orders: Orders Placed This Encounter  Procedures  . XR Hand 2 View Left  . XR Hand 2 View Right  . XR Foot 2 Views Left  . XR Foot 2 Views Right  . QuantiFERON-TB Gold Plus  . Ambulatory referral to Physical Medicine Rehab   No orders of the defined types were placed in this encounter.   Face-to-face time spent with patient was 30 minutes. Greater than 50% of time was spent in counseling and coordination of care.  Follow-Up Instructions: Return in about 3 months (around 05/20/2019) for Psoriatic arthritis, Osteoarthritis.   Ofilia Neas, PA-C  I examined and evaluated the patient with Hazel Sams PA.  Patient had no synovitis on examination although he continues to have some discomfort in his hands and feet.  The x-rays were unremarkable except for some old cystic versus  erosive changes and osteoarthritic changes.  He has been noncompliant with his methotrexate dosing.  He will be taking Cosentyx on regular basis and also methotrexate on regular basis.  We will see response to the combination therapy for now.  He has been also experiencing numbness in her bilateral hands.  We will schedule nerve conduction velocity to look for carpal tunnel syndrome.  The plan of care was discussed as noted above.  Bo Merino, MD Note - This record has been created using Editor, commissioning.  Chart creation errors have been sought, but may not always  have been located. Such creation errors do not reflect on  the standard of medical care.

## 2019-02-07 ENCOUNTER — Other Ambulatory Visit: Payer: Self-pay | Admitting: Physician Assistant

## 2019-02-09 NOTE — Telephone Encounter (Signed)
Last Visit: 08/06/18 Next Visit: 02/17/19 Labs: 01/19/19 Glucose is mildly elevated.  Okay to refill per Dr. Estanislado Pandy

## 2019-02-17 ENCOUNTER — Ambulatory Visit: Payer: Self-pay

## 2019-02-17 ENCOUNTER — Ambulatory Visit: Payer: Commercial Managed Care - PPO | Admitting: Rheumatology

## 2019-02-17 ENCOUNTER — Encounter: Payer: Self-pay | Admitting: Physician Assistant

## 2019-02-17 ENCOUNTER — Other Ambulatory Visit: Payer: Self-pay

## 2019-02-17 VITALS — BP 150/98 | HR 75 | Resp 14 | Ht 72.0 in | Wt 204.6 lb

## 2019-02-17 DIAGNOSIS — L409 Psoriasis, unspecified: Secondary | ICD-10-CM

## 2019-02-17 DIAGNOSIS — R202 Paresthesia of skin: Secondary | ICD-10-CM | POA: Diagnosis not present

## 2019-02-17 DIAGNOSIS — M19072 Primary osteoarthritis, left ankle and foot: Secondary | ICD-10-CM

## 2019-02-17 DIAGNOSIS — M79672 Pain in left foot: Secondary | ICD-10-CM | POA: Diagnosis not present

## 2019-02-17 DIAGNOSIS — Z79899 Other long term (current) drug therapy: Secondary | ICD-10-CM | POA: Diagnosis not present

## 2019-02-17 DIAGNOSIS — M79641 Pain in right hand: Secondary | ICD-10-CM

## 2019-02-17 DIAGNOSIS — M5136 Other intervertebral disc degeneration, lumbar region: Secondary | ICD-10-CM

## 2019-02-17 DIAGNOSIS — L4050 Arthropathic psoriasis, unspecified: Secondary | ICD-10-CM | POA: Diagnosis not present

## 2019-02-17 DIAGNOSIS — M19042 Primary osteoarthritis, left hand: Secondary | ICD-10-CM

## 2019-02-17 DIAGNOSIS — Z789 Other specified health status: Secondary | ICD-10-CM

## 2019-02-17 DIAGNOSIS — Z7289 Other problems related to lifestyle: Secondary | ICD-10-CM

## 2019-02-17 DIAGNOSIS — M79671 Pain in right foot: Secondary | ICD-10-CM

## 2019-02-17 DIAGNOSIS — M19041 Primary osteoarthritis, right hand: Secondary | ICD-10-CM

## 2019-02-17 DIAGNOSIS — M79642 Pain in left hand: Secondary | ICD-10-CM

## 2019-02-17 DIAGNOSIS — M19071 Primary osteoarthritis, right ankle and foot: Secondary | ICD-10-CM

## 2019-03-13 ENCOUNTER — Ambulatory Visit (INDEPENDENT_AMBULATORY_CARE_PROVIDER_SITE_OTHER): Payer: Commercial Managed Care - PPO | Admitting: Physical Medicine and Rehabilitation

## 2019-03-13 ENCOUNTER — Encounter: Payer: Self-pay | Admitting: Physical Medicine and Rehabilitation

## 2019-03-13 DIAGNOSIS — R202 Paresthesia of skin: Secondary | ICD-10-CM | POA: Diagnosis not present

## 2019-03-13 NOTE — Progress Notes (Signed)
.  Numeric Pain Rating Scale and Functional Assessment Average Pain 6   In the last MONTH (on 0-10 scale) has pain interfered with the following?  1. General activity like being  able to carry out your everyday physical activities such as walking, climbing stairs, carrying groceries, or moving a chair?  Rating(8)    

## 2019-03-16 ENCOUNTER — Telehealth: Payer: Self-pay | Admitting: *Deleted

## 2019-03-16 NOTE — Procedures (Signed)
EMG & NCV Findings: Evaluation of the left median motor nerve showed prolonged distal onset latency (6.4 ms), reduced amplitude (3.9 mV), and decreased conduction velocity (Elbow-Wrist, 45 m/s).  The left median (across palm) sensory and the right median (across palm) sensory nerves showed prolonged distal peak latency (Wrist, L4.6, R3.7 ms).  All remaining nerves (as indicated in the following tables) were within normal limits.  Left vs. Right side comparison data for the median motor nerve indicates abnormal L-R latency difference (2.4 ms).  All remaining left vs. right side differences were within normal limits.    All examined muscles (as indicated in the following table) showed no evidence of electrical instability.    Impression: The above electrodiagnostic study is ABNORMAL and reveals evidence of:  1. A moderate to severe left median nerve entrapment at the wrist (carpal tunnel syndrome) affecting sensory and motor components.   2. A mild right median nerve entrapment at the wrist (carpal tunnel syndrome) affecting sensory components.  There is no significant electrodiagnostic evidence of any other focal nerve entrapment, brachial plexopathy or generalized peripheral neuropathy.   Recommendations: 1.  Follow-up with referring physician. 2.  Continue current management of symptoms. 3.  Continue use of resting splint at night-time and as needed during the day. 4.  Suggest surgical evaluation.  ___________________________ Laurence Spates FAAPMR Board Certified, American Board of Physical Medicine and Rehabilitation    Nerve Conduction Studies Anti Sensory Summary Table   Stim Site NR Peak (ms) Norm Peak (ms) P-T Amp (V) Norm P-T Amp Site1 Site2 Delta-P (ms) Dist (cm) Vel (m/s) Norm Vel (m/s)  Left Median Acr Palm Anti Sensory (2nd Digit)  31.5C  Wrist    *4.6 <3.6 15.7 >10 Wrist Palm 2.8 0.0    Palm    1.8 <2.0 19.9         Right Median Acr Palm Anti Sensory (2nd Digit)  31.9C   Wrist    *3.7 <3.6 21.9 >10 Wrist Palm 1.9 0.0    Palm    1.8 <2.0 11.9         Left Radial Anti Sensory (Base 1st Digit)  31.4C  Wrist    2.2 <3.1 19.1  Wrist Base 1st Digit 2.2 0.0    Right Radial Anti Sensory (Base 1st Digit)  31C  Wrist    2.2 <3.1 27.3  Wrist Base 1st Digit 2.2 0.0    Left Ulnar Anti Sensory (5th Digit)  31.7C  Wrist    3.3 <3.7 20.1 >15.0 Wrist 5th Digit 3.3 14.0 42 >38  Right Ulnar Anti Sensory (5th Digit)  31.5C  Wrist    3.1 <3.7 17.5 >15.0 Wrist 5th Digit 3.1 14.0 45 >38   Motor Summary Table   Stim Site NR Onset (ms) Norm Onset (ms) O-P Amp (mV) Norm O-P Amp Site1 Site2 Delta-0 (ms) Dist (cm) Vel (m/s) Norm Vel (m/s)  Left Median Motor (Abd Poll Brev)  31.4C  Wrist    *6.4 <4.2 *3.9 >5 Elbow Wrist 4.9 22.0 *45 >50  Elbow    11.3  3.9         Right Median Motor (Abd Poll Brev)  30.8C  Wrist    4.0 <4.2 8.3 >5 Elbow Wrist 4.4 23.5 53 >50  Elbow    8.4  8.2         Left Ulnar Motor (Abd Dig Min)  31.4C  Wrist    3.0 <4.2 9.3 >3 B Elbow Wrist 3.5 21.5 61 >53  B Elbow  6.5  9.0  A Elbow B Elbow 1.2 10.0 83 >53  A Elbow    7.7  8.5         Right Ulnar Motor (Abd Dig Min)  30.7C  Wrist    2.8 <4.2 9.8 >3 B Elbow Wrist 3.7 22.5 61 >53  B Elbow    6.5  9.6  A Elbow B Elbow 1.2 10.0 83 >53  A Elbow    7.7  9.6          EMG   Side Muscle Nerve Root Ins Act Fibs Psw Amp Dur Poly Recrt Int Fraser Din Comment  Left Abd Poll Brev Median C8-T1 Nml Nml Nml Nml Nml 0 Nml Nml   Left 1stDorInt Ulnar C8-T1 Nml Nml Nml Nml Nml 0 Nml Nml   Left PronatorTeres Median C6-7 Nml Nml Nml Nml Nml 0 Nml Nml     Nerve Conduction Studies Anti Sensory Left/Right Comparison   Stim Site L Lat (ms) R Lat (ms) L-R Lat (ms) L Amp (V) R Amp (V) L-R Amp (%) Site1 Site2 L Vel (m/s) R Vel (m/s) L-R Vel (m/s)  Median Acr Palm Anti Sensory (2nd Digit)  31.5C  Wrist *4.6 *3.7 0.9 15.7 21.9 28.3 Wrist Palm     Palm 1.8 1.8 0.0 19.9 11.9 40.2       Radial Anti Sensory (Base 1st Digit)   31.4C  Wrist 2.2 2.2 0.0 19.1 27.3 30.0 Wrist Base 1st Digit     Ulnar Anti Sensory (5th Digit)  31.7C  Wrist 3.3 3.1 0.2 20.1 17.5 12.9 Wrist 5th Digit 42 45 3   Motor Left/Right Comparison   Stim Site L Lat (ms) R Lat (ms) L-R Lat (ms) L Amp (mV) R Amp (mV) L-R Amp (%) Site1 Site2 L Vel (m/s) R Vel (m/s) L-R Vel (m/s)  Median Motor (Abd Poll Brev)  31.4C  Wrist *6.4 4.0 *2.4 *3.9 8.3 53.0 Elbow Wrist *45 53 8  Elbow 11.3 8.4 2.9 3.9 8.2 52.4       Ulnar Motor (Abd Dig Min)  31.4C  Wrist 3.0 2.8 0.2 9.3 9.8 5.1 B Elbow Wrist 61 61 0  B Elbow 6.5 6.5 0.0 9.0 9.6 6.2 A Elbow B Elbow 83 83 0  A Elbow 7.7 7.7 0.0 8.5 9.6 11.5          Waveforms:

## 2019-03-16 NOTE — Telephone Encounter (Signed)
Patient advised he has moderate left carpal tunnel syndrome and mild left carpal tunnel syndrome. He should consider getting the left carpal tunnel release. He may benefit from injection in the right carpal tunnel.

## 2019-03-16 NOTE — Progress Notes (Signed)
Mitchell Morris - 53 y.o. male MRN [id number]  Date of birth: Dec 04, 1965  Office Visit Note: Visit Date: 03/13/2019 PCP: Kathyrn Lass, MD Referred by: Kathyrn Lass, MD  Subjective: Chief Complaint  Patient presents with  . Left Wrist - Pain  . Right Wrist - Pain  . Left Hand - Numbness   HPI:  Mitchell Morris is a 53 y.o. male who comes in today At the request of Dr. Bo Merino for electrodiagnostic study of the bilateral upper limbs.  Patient is right-hand dominant but with left more than right hand symptoms with pain in both wrists and right and left wrists and thumbs.  He also has numbness and tingling in the thumb index and middle fingers on both hands but particularly in the left hand.  He reports the symptoms started several weeks ago and that they are constant at this point.  He has been wearing a brace which helps some of the symptoms.  He only wears the brace intermittently.  He has not had prior electrodiagnostic studies.  He is treated by Dr. Estanislado Pandy for psoriatic arthritis.  He feels like he has a lot of pain all over his joints in general.  No specific radicular symptoms.  No specific trauma.  Patient is nondiabetic.  ROS Otherwise per HPI.  Assessment & Plan: Visit Diagnoses:  1. Paresthesia of skin     Plan: Impression: The above electrodiagnostic study is ABNORMAL and reveals evidence of:  1. A moderate to severe left median nerve entrapment at the wrist (carpal tunnel syndrome) affecting sensory and motor components.   2. A mild right median nerve entrapment at the wrist (carpal tunnel syndrome) affecting sensory components.  There is no significant electrodiagnostic evidence of any other focal nerve entrapment, brachial plexopathy or generalized peripheral neuropathy.   Recommendations: 1.  Follow-up with referring physician. 2.  Continue current management of symptoms. 3.  Continue use of resting splint at night-time and as needed during the  day. 4.  Suggest surgical evaluation.  Meds & Orders: No orders of the defined types were placed in this encounter.   Orders Placed This Encounter  Procedures  . NCV with EMG (electromyography)    Follow-up: Return for Bo Merino, MD.   Procedures: No procedures performed  EMG & NCV Findings: Evaluation of the left median motor nerve showed prolonged distal onset latency (6.4 ms), reduced amplitude (3.9 mV), and decreased conduction velocity (Elbow-Wrist, 45 m/s).  The left median (across palm) sensory and the right median (across palm) sensory nerves showed prolonged distal peak latency (Wrist, L4.6, R3.7 ms).  All remaining nerves (as indicated in the following tables) were within normal limits.  Left vs. Right side comparison data for the median motor nerve indicates abnormal L-R latency difference (2.4 ms).  All remaining left vs. right side differences were within normal limits.    All examined muscles (as indicated in the following table) showed no evidence of electrical instability.    Impression: The above electrodiagnostic study is ABNORMAL and reveals evidence of:  1. A moderate to severe left median nerve entrapment at the wrist (carpal tunnel syndrome) affecting sensory and motor components.   2. A mild right median nerve entrapment at the wrist (carpal tunnel syndrome) affecting sensory components.  There is no significant electrodiagnostic evidence of any other focal nerve entrapment, brachial plexopathy or generalized peripheral neuropathy.   Recommendations: 1.  Follow-up with referring physician. 2.  Continue current management of symptoms. 3.  Continue use  of resting splint at night-time and as needed during the day. 4.  Suggest surgical evaluation.  ___________________________ Laurence Spates FAAPMR Board Certified, American Board of Physical Medicine and Rehabilitation    Nerve Conduction Studies Anti Sensory Summary Table   Stim Site NR Peak (ms) Norm  Peak (ms) P-T Amp (V) Norm P-T Amp Site1 Site2 Delta-P (ms) Dist (cm) Vel (m/s) Norm Vel (m/s)  Left Median Acr Palm Anti Sensory (2nd Digit)  31.5C  Wrist    *4.6 <3.6 15.7 >10 Wrist Palm 2.8 0.0    Palm    1.8 <2.0 19.9         Right Median Acr Palm Anti Sensory (2nd Digit)  31.9C  Wrist    *3.7 <3.6 21.9 >10 Wrist Palm 1.9 0.0    Palm    1.8 <2.0 11.9         Left Radial Anti Sensory (Base 1st Digit)  31.4C  Wrist    2.2 <3.1 19.1  Wrist Base 1st Digit 2.2 0.0    Right Radial Anti Sensory (Base 1st Digit)  31C  Wrist    2.2 <3.1 27.3  Wrist Base 1st Digit 2.2 0.0    Left Ulnar Anti Sensory (5th Digit)  31.7C  Wrist    3.3 <3.7 20.1 >15.0 Wrist 5th Digit 3.3 14.0 42 >38  Right Ulnar Anti Sensory (5th Digit)  31.5C  Wrist    3.1 <3.7 17.5 >15.0 Wrist 5th Digit 3.1 14.0 45 >38   Motor Summary Table   Stim Site NR Onset (ms) Norm Onset (ms) O-P Amp (mV) Norm O-P Amp Site1 Site2 Delta-0 (ms) Dist (cm) Vel (m/s) Norm Vel (m/s)  Left Median Motor (Abd Poll Brev)  31.4C  Wrist    *6.4 <4.2 *3.9 >5 Elbow Wrist 4.9 22.0 *45 >50  Elbow    11.3  3.9         Right Median Motor (Abd Poll Brev)  30.8C  Wrist    4.0 <4.2 8.3 >5 Elbow Wrist 4.4 23.5 53 >50  Elbow    8.4  8.2         Left Ulnar Motor (Abd Dig Min)  31.4C  Wrist    3.0 <4.2 9.3 >3 B Elbow Wrist 3.5 21.5 61 >53  B Elbow    6.5  9.0  A Elbow B Elbow 1.2 10.0 83 >53  A Elbow    7.7  8.5         Right Ulnar Motor (Abd Dig Min)  30.7C  Wrist    2.8 <4.2 9.8 >3 B Elbow Wrist 3.7 22.5 61 >53  B Elbow    6.5  9.6  A Elbow B Elbow 1.2 10.0 83 >53  A Elbow    7.7  9.6          EMG   Side Muscle Nerve Root Ins Act Fibs Psw Amp Dur Poly Recrt Int Fraser Din Comment  Left Abd Poll Brev Median C8-T1 Nml Nml Nml Nml Nml 0 Nml Nml   Left 1stDorInt Ulnar C8-T1 Nml Nml Nml Nml Nml 0 Nml Nml   Left PronatorTeres Median C6-7 Nml Nml Nml Nml Nml 0 Nml Nml     Nerve Conduction Studies Anti Sensory Left/Right Comparison   Stim Site L Lat  (ms) R Lat (ms) L-R Lat (ms) L Amp (V) R Amp (V) L-R Amp (%) Site1 Site2 L Vel (m/s) R Vel (m/s) L-R Vel (m/s)  Median Acr Palm Anti Sensory (2nd Digit)  [address]  Wrist *4.6 *3.7 0.9 15.7 21.9 28.3 Wrist Palm     Palm 1.8 1.8 0.0 19.9 11.9 40.2       Radial Anti Sensory (Base 1st Digit)  31.4C  Wrist 2.2 2.2 0.0 19.1 27.3 30.0 Wrist Base 1st Digit     Ulnar Anti Sensory (5th Digit)  31.7C  Wrist 3.3 3.1 0.2 20.1 17.5 12.9 Wrist 5th Digit 42 45 3   Motor Left/Right Comparison   Stim Site L Lat (ms) R Lat (ms) L-R Lat (ms) L Amp (mV) R Amp (mV) L-R Amp (%) Site1 Site2 L Vel (m/s) R Vel (m/s) L-R Vel (m/s)  Median Motor (Abd Poll Brev)  31.4C  Wrist *6.4 4.0 *2.4 *3.9 8.3 53.0 Elbow Wrist *45 53 8  Elbow 11.3 8.4 2.9 3.9 8.2 52.4       Ulnar Motor (Abd Dig Min)  31.4C  Wrist 3.0 2.8 0.2 9.3 9.8 5.1 B Elbow Wrist 61 61 0  B Elbow 6.5 6.5 0.0 9.0 9.6 6.2 A Elbow B Elbow 83 83 0  A Elbow 7.7 7.7 0.0 8.5 9.6 11.5          Waveforms:                     Clinical History: No specialty comments available.     Objective:  VS:  HT:    WT:   BMI:     BP:   HR: bpm  TEMP: ( )  RESP:  Physical Exam Musculoskeletal:        General: No tenderness.     Comments: Inspection reveals no atrophy of the bilateral APB or FDI or hand intrinsics. There is no swelling, color changes, allodynia or dystrophic changes. There is 5 out of 5 strength in the bilateral wrist extension, finger abduction and long finger flexion. There is intact sensation to light touch in all dermatomal and peripheral nerve distributions.  There is a negative Hoffmann's test bilaterally.  Skin:    General: Skin is warm and dry.     Findings: No erythema or rash.  Neurological:     General: No focal deficit present.     Mental Status: He is alert and oriented to person, place, and time.     Sensory: No sensory deficit.     Motor: No weakness or abnormal muscle tone.     Coordination: Coordination normal.      Gait: Gait normal.  Psychiatric:        Mood and Affect: Mood normal.        Behavior: Behavior normal.        Thought Content: Thought content normal.     Ortho Exam Imaging: No results found.

## 2019-03-16 NOTE — Telephone Encounter (Signed)
-----   Message from Bo Merino, MD sent at 03/16/2019  1:33 PM EDT ----- Patient has moderate left carpal tunnel syndrome and mild left carpal tunnel syndrome.  He should consider getting the left carpal tunnel release.  He may benefit from injection in the right carpal tunnel. Bo Merino, MD  ----- Message ----- From: Magnus Sinning, MD Sent: 03/13/2019   8:57 AM EDT To: Bo Merino, MD  Report to follow but he has a good case of moderate CTS on left and mild on right, not other findings. Could consider release on left.

## 2019-03-17 ENCOUNTER — Telehealth: Payer: Self-pay | Admitting: Pharmacist

## 2019-03-17 ENCOUNTER — Other Ambulatory Visit: Payer: Self-pay

## 2019-03-17 ENCOUNTER — Encounter: Payer: Self-pay | Admitting: Rheumatology

## 2019-03-17 ENCOUNTER — Ambulatory Visit: Payer: Commercial Managed Care - PPO | Admitting: Rheumatology

## 2019-03-17 VITALS — BP 151/90 | HR 95 | Resp 15 | Ht 72.0 in | Wt 207.0 lb

## 2019-03-17 DIAGNOSIS — Z79899 Other long term (current) drug therapy: Secondary | ICD-10-CM | POA: Diagnosis not present

## 2019-03-17 DIAGNOSIS — L409 Psoriasis, unspecified: Secondary | ICD-10-CM

## 2019-03-17 DIAGNOSIS — M19042 Primary osteoarthritis, left hand: Secondary | ICD-10-CM

## 2019-03-17 DIAGNOSIS — M19041 Primary osteoarthritis, right hand: Secondary | ICD-10-CM

## 2019-03-17 DIAGNOSIS — M19071 Primary osteoarthritis, right ankle and foot: Secondary | ICD-10-CM

## 2019-03-17 DIAGNOSIS — Z789 Other specified health status: Secondary | ICD-10-CM

## 2019-03-17 DIAGNOSIS — R202 Paresthesia of skin: Secondary | ICD-10-CM

## 2019-03-17 DIAGNOSIS — M5136 Other intervertebral disc degeneration, lumbar region: Secondary | ICD-10-CM

## 2019-03-17 DIAGNOSIS — L4050 Arthropathic psoriasis, unspecified: Secondary | ICD-10-CM

## 2019-03-17 DIAGNOSIS — Z7289 Other problems related to lifestyle: Secondary | ICD-10-CM

## 2019-03-17 DIAGNOSIS — M19072 Primary osteoarthritis, left ankle and foot: Secondary | ICD-10-CM

## 2019-03-17 MED ORDER — COSENTYX SENSOREADY (300 MG) 150 MG/ML ~~LOC~~ SOAJ: 150.0000 mg | SUBCUTANEOUS | 0 refills | Status: DC

## 2019-03-17 NOTE — Progress Notes (Signed)
Office Visit Note  Patient: Mitchell Morris             Date of Birth: [birth year]           MRN: [id number]             PCP: Kathyrn Lass, MD Referring: Kathyrn Lass, MD Visit Date: 03/17/2019 Occupation: @GUAROCC @  Subjective:  Discuss medications    History of Present Illness: Mitchell Morris is a 53 y.o. male with history of psoriatic arthritis, osteoarthritis, and DDD.  He is on Cosentyx 150 mg sq injections every 14 days and MTX 6 tablets po once weekly.  He presents today to discuss his current treatment regimen.  He does not feel as though Cosentyx is as effective as it was in the past.  At his last visit on 02/17/2019 and we discussed splitting up the dose of Cosentyx to 150 mg every 14 days.  He has had an injection since his last visit.  He has not noticed any improvement yet.  He continues to have paresthesias in both hands.  He had nerve conduction study performed by Dr. Ernestina Patches on 03/13/2019 which revealed severe left carpal tunnel syndrome and mild right carpal tunnel syndrome.  He is hesitant to proceed with surgery at this time.  He has been wearing a night splint on the left wrist.  He continues to have pain and intermittent swelling in the left knee joint.  He denies any pain in his feet at this time.  He denies any Achilles tendinitis or plantar fasciitis.  He denies any SI joint pain.  He has occasional scattered patches of psoriasis on his lower extremities.   Activities of Daily Living:  Patient reports morning stiffness for 0 minutes.   Patient Reports nocturnal pain.  Difficulty dressing/grooming: Denies Difficulty climbing stairs: Denies Difficulty getting out of chair: Denies Difficulty using hands for taps, buttons, cutlery, and/or writing: Reports  Review of Systems  Constitutional: Positive for fatigue.  HENT: Negative for mouth sores, mouth dryness and nose dryness.   Eyes: Negative for pain, itching and dryness.  Respiratory: Negative for shortness  of breath, wheezing and difficulty breathing.   Cardiovascular: Negative for chest pain, palpitations and swelling in legs/feet.  Gastrointestinal: Negative for blood in stool, constipation and diarrhea.  Endocrine: Negative for increased urination.  Genitourinary: Negative for painful urination.  Musculoskeletal: Positive for arthralgias, joint pain and joint swelling. Negative for morning stiffness.  Skin: Negative for rash and redness.  Allergic/Immunologic: Negative for susceptible to infections.  Neurological: Positive for numbness. Negative for dizziness, light-headedness, headaches, memory loss and weakness.  Hematological: Negative for bruising/bleeding tendency.  Psychiatric/Behavioral: Positive for sleep disturbance. Negative for confusion.    PMFS History:  Patient Active Problem List   Diagnosis Date Noted  . Primary osteoarthritis of both hands 03/12/2017  . Primary osteoarthritis of both feet 03/12/2017  . DDD (degenerative disc disease), lumbar 03/12/2017  . Alcohol use/ keep Methotrexate dose low  03/12/2017  . Psoriatic arthritis ([place]) 09/27/2016  . Psoriasis 09/27/2016  . High risk medication use 09/27/2016    Past Medical History:  Diagnosis Date  . Psoriatic arthritis ([place])     Family History  Problem Relation Age of Onset  . Alzheimer's disease Mother   . Hypertension Father   . Cancer Brother        testicular   . Healthy Daughter   . Healthy Son    Past Surgical History:  Procedure Laterality Date  . KNEE  ARTHROPLASTY     Social History   Social History Narrative  . Not on file    There is no immunization history on file for this patient.   Objective: Vital Signs: BP (!) 151/90 (BP Location: Left Arm, Patient Position: Sitting, Cuff Size: Normal)   Pulse 95   Resp 15   Ht 6' (1.829 m)   Wt 207 lb (93.9 kg)   BMI 28.07 kg/m    Physical Exam Vitals signs and nursing note reviewed.  Constitutional:      Appearance: He is well-developed.   HENT:     Head: Normocephalic and atraumatic.  Eyes:     Conjunctiva/sclera: Conjunctivae normal.     Pupils: Pupils are equal, round, and reactive to light.  Neck:     Musculoskeletal: Normal range of motion and neck supple.  Cardiovascular:     Rate and Rhythm: Normal rate and regular rhythm.     Heart sounds: Normal heart sounds.  Pulmonary:     Effort: Pulmonary effort is normal.     Breath sounds: Normal breath sounds.  Abdominal:     General: Bowel sounds are normal.     Palpations: Abdomen is soft.  Skin:    General: Skin is warm and dry.     Capillary Refill: Capillary refill takes less than 2 seconds.  Neurological:     Mental Status: He is alert and oriented to person, place, and time.  Psychiatric:        Behavior: Behavior normal.      Musculoskeletal Exam: C-spine, thoracic spine, lumbar spine good range of motion.  No midline spinal tenderness.  No SI joint tenderness.  Shoulder joints, elbow joints, wrist joints, MCPs, PIPs, DIPs good range of motion no synovitis.  Positive Tinel sign at bilateral wrist.  He has PIP and DIP synovial thickening.  Hip joints have good range of motion with no discomfort. Knee joints have good ROM.  No warmth or effusion of knee joints.  No ankle joint tenderness or swelling. No achilles tendonitis or plantar fasciitis.    CDAI Exam: CDAI Score: - Patient Global: -; Provider Global: - Swollen: -; Tender: - Joint Exam   No joint exam has been documented for this visit   There is currently no information documented on the homunculus. Go to the Rheumatology activity and complete the homunculus joint exam.  Investigation: No additional findings.  Imaging: Xr Foot 2 Views Left  Result Date: 02/17/2019 All PIP and DIP minimal narrowing was noted.  No erosive changes were noted.  No metatarsal joint space narrowing or erosive changes were noted.  No intertarsal or tibiotalar joint space narrowing was noted.  Small calcaneal spur was  noted.  Soft tissue swelling of the right third digit was noted. Impression: These findings are consistent with osteoarthritis and psoriatic arthritis overlap.  Xr Foot 2 Views Right  Result Date: 02/17/2019 First MTP, all PIP and DIP joint space narrowing was noted.  No erosive changes were noted.  No intertarsal tibiotalar joint space narrowing was noted. Impression: These findings are consistent with osteoarthritis and psoriatic arthritis overlap.  Xr Hand 2 View Left  Result Date: 02/17/2019 PIP and DIP narrowing was noted.  No MCP, intercarpal radiocarpal joint space narrowing was noted.  Possible cystic versus erosive changes noted in the base of the third metacarpal. Impression: These findings are consistent with osteoarthritis and psoriatic arthritis overlap.  Xr Hand 2 View Right  Result Date: 02/17/2019 PIP and DIP narrowing was noted.  No MCP, metacarpocarpal, intercarpal or radiocarpal joint space narrowing was noted.  Possible cystic or erosive changes noted in the scaphoid.  Soft tissue swelling of the right third digit was noted. Impression: These findings are consistent with osteoarthritis and psoriatic arthritis overlap.   Recent Labs: Lab Results  Component Value Date   WBC 5.2 01/19/2019   HGB 15.2 01/19/2019   PLT 254 01/19/2019   NA 139 01/19/2019   K 4.3 01/19/2019   CL 101 01/19/2019   CO2 28 01/19/2019   GLUCOSE 139 (H) 01/19/2019   BUN 15 01/19/2019   CREATININE 1.15 01/19/2019   BILITOT 0.5 01/19/2019   ALKPHOS 61 03/19/2017   AST 21 01/19/2019   ALT 30 01/19/2019   PROT 7.3 01/19/2019   ALBUMIN 4.5 03/19/2017   CALCIUM 9.7 01/19/2019   GFRAA 84 01/19/2019   QFTBGOLDPLUS NEGATIVE 03/18/2018    Speciality Comments: Prior therapy: Stelara (inadequate response) and Humira (good response but issues with coverage)  Procedures:  No procedures performed Allergies: Patient has no known allergies.        Assessment / Plan:     Visit Diagnoses:  Psoriatic arthritis (HCC)-hx of dactylitis: He has no synovitis or dactylitis on exam today.  He continues to have intermittent left knee joint pain and swelling.  He has no warmth or effusion on exam today.  He has paresthesias in both hands, and he underwent a NCV study with Dr. Ernestina Patches on 03/13/19 and has bilateral carpal tunnel syndrome.  He has no tenderness or synovitis of his wrist joints at this time. He has no achilles tendonitis or plantar fasciitis.  He has no SI joint tenderness.  He has occasional scattered patches of psoriasis on LEs.  We recommended spacing the dose of Cosentyx to 150 mg sq every 14 days at his visit on 02/17/19.  We discussed switching from oral MTX to injectable MTX.  He will inject MTX 0.6 ml sq once weekly and continue taking folic acid 1 mg po daily.  He will follow up in 3 months.     Psoriasis - He has occasional scattered patches of psoriasis on his lower extremities.   High risk medication use - Cosentyx 150 mg sq every 14 days, methotrexate 0.6 ml sq injections every 7 days, and folic acid 1 mg 1 tablet daily. Cosentyx was started in April 2017.  Advised to take 150 mg every 14 days at last visit on 02/17/2019.  He was previously treated with Stelara and had inadequate response.  Also treated with Humira in the past but unable to tell if it was stopped due to inadequate response or insurance issues.  Due for TB gold. 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  Hepatitis panel negative 01/30/2012. No chest x-ray, HIV, SPEP, or immunoglobulins on file. He has had a CXR at his PCP office and he will have it faxed to [place].   Paresthesia of both hands - He continues to have paresthesias of both hands.  Positive Tinel's sign bilaterally.  He was evaluated by Dr. Ernestina Patches for paresthesias of both hands, and he had a EMG and [place].   Findings from 03/13/2019: Moderate to severe left median nerve entrapment at the wrist  affecting sensory and motor components.  Mild right median nerve current treatment at the wrist affecting sensory components noted.  It was recommended that he continue using resting splints at nighttime.  We will  schedule an ultrasound guided injection for carpal tunnel of the left wrist.   Primary osteoarthritis of both hands - He has PIP and DIP synovial thickening consistent with osteoarthritis of both hands.  He has no synovitis or dactylitis on exam.  Joint protection and muscle strengthening consistent with osteoarthritis of both hands.   Primary osteoarthritis of both feet - He has PIP and DIP synovial thickening consistent with osteoarthritis of both feet. He wears proper fitting shoes.   Alcohol use/ keep Methotrexate dose low  - He is being switched from oral MTX to injectable MTX.  He will be on MTX 0.6 ml sq once weekly.    DDD (degenerative disc disease), lumbar -He has no lower back pain at this time. No midline spinal tenderness.   Orders: No orders of the defined types were placed in this encounter.  No orders of the defined types were placed in this encounter.   Face-to-face time spent with patient was 30 minutes. Greater than 50% of time was spent in counseling and coordination of care.  Follow-Up Instructions: Return in about 3 months (around 06/17/2019) for Psoriatic arthritis, Osteoarthritis.   Ofilia Neas, PA-C   I examined and evaluated the patient with Hazel Sams PA.  Patient complains of increased pain and discomfort in his joints.  He has mild inflammation over his DIP joints on my examination.  He also complains of paresthesias in his bilateral hands.  His nerve conduction velocity was consistent with moderate carpal tunnel syndrome.  We will schedule ultrasound-guided injection as he does not want to have surgery.  We will be also switching him to subcu methotrexate.  The plan of care was discussed as noted above.  Bo Merino, MD  Note - This record  has been created using Editor, commissioning.  Chart creation errors have been sought, but may not always  have been located. Such creation errors do not reflect on  the standard of medical care.

## 2019-03-17 NOTE — Telephone Encounter (Signed)
Please start benefits investigation for Otrexup 15 mg.    He is currently on Cosentyx and oral tablets with inadequate response. He was previously treated with Stelara and Humira and had inadequate response.

## 2019-03-17 NOTE — Patient Instructions (Signed)
Please have PCP fax chest x-ray to our office [phone]

## 2019-03-17 NOTE — Progress Notes (Signed)
Pharmacy Note  Subjective:  Patient presents today to the [facility] to see Dr. Estanislado Pandy.   Patient seen by the pharmacist for counseling on Otrexup.  He is currently taking oral methotrexate 6 tablets weekly and Cosentyx 150 mg every 14 days with inadequate response. He was previously treated with Stelara and had inadequate response.  Also treated with Humira in the past but can't remember if it was stopped due to inadequate response or insurance issues.  Objective: Current Outpatient Medications on File Prior to Visit  Medication Sig Dispense Refill  . folic acid (FOLVITE) 1 MG tablet Take 1 tablet (1 mg total) by mouth daily. 120 tablet 2  . methotrexate (RHEUMATREX) 2.5 MG tablet TAKE 6 TABLETS BY MOUTH ONCE WEEKLY. CHEMOTHERAPY. PROTECT FROM LIGHT. 72 tablet 0  . Multiple Vitamins-Minerals (MULTIVITAMIN ADULT PO) Take by mouth as needed.     . Secukinumab, 300 MG Dose, (COSENTYX SENSOREADY, 300 MG,) 150 MG/ML SOAJ Inject 300 mg into the skin every 28 (twenty-eight) days. 6 pen 0  . VITAMIN D PO Take 2,000 Units by mouth.     No current facility-administered medications on file prior to visit.      Assessment/Plan:  He wants to switch to injectable methotrexate.  Patient given option to try vial and syringe today or wait to see if insurance will over pen device.  Patient would like to wait to see if Otrexup or Rasuvo will be covered through insurance.  Will apply for Otrexup approval through insurance as it has better co-pay assistance.  Will update when we receive a response.  He will continue oral methotrexate in the meantime.  Reviewed appropriate injection technique with Otrexup demo pen.  Last CBC/CMP within normal limits on 01/19/2019.  He is due for TB gold today for Cosentyx monitoring.  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 Hepatitis panel negative 01/30/2012. No chest x-ray, HIV, SPEP, or  immunoglobulins on file. e was advised twice prior to starting MTX to obtain chest x-ray but no records anywhere.  Patient states he has had a chest x-ray with PCP.  Instructed patient to have them fax a copy to our office.  Will obtain HIV, SPEP, and immunoglobulins today.    All questions encouraged and answered.  Instructed patient to call with any other questions or concerns.  Mariella Saa, PharmD, [place], [place] Clinical Specialty Pharmacist [phone]  03/17/2019 4:20 PM

## 2019-03-18 NOTE — Telephone Encounter (Signed)
Submitted a Prior Authorization request to [employer] for RASUVO via Cover My Meds. Will update once we receive a response.  1:51 PM Beatriz Chancellor, CPhT

## 2019-03-18 NOTE — Telephone Encounter (Signed)
Received notification from [employer] regarding a prior authorization for OTREXUP. Authorization has been APPROVED from 03/18/19 to 03/17/20.   Will send document to scan center.  Authorization # [id number] Phone # [phone]  Ran test claim for 4 pens for 28 day supply, patient's copay is $325.53. Patient has a Nature conservation officer, so he would be eligible to use a copay card.   12:30 PM Beatriz Chancellor, CPhT

## 2019-03-18 NOTE — Telephone Encounter (Signed)
Submitted a Prior Authorization request to [employer] for OTREXUP via Cover My Meds. Will update once we receive a response.  Ran test claim for MTX vial- patient's copay for 28 days supply (4 vials) is $14.24.  9:40 AM Beatriz Chancellor, CPhT

## 2019-03-20 LAB — PROTEIN ELECTROPHORESIS, SERUM, WITH REFLEX
Albumin ELP: 4.3 g/dL (ref 3.8–4.8)
Alpha 1: 0.3 g/dL (ref 0.2–0.3)
Alpha 2: 0.6 g/dL (ref 0.5–0.9)
Beta 2: 0.5 g/dL (ref 0.2–0.5)
Beta Globulin: 0.5 g/dL (ref 0.4–0.6)
Gamma Globulin: 1.1 g/dL (ref 0.8–1.7)
Total Protein: 7.3 g/dL (ref 6.1–8.1)

## 2019-03-20 LAB — HIV ANTIBODY (ROUTINE TESTING W REFLEX): HIV 1&2 Ab, 4th Generation: NONREACTIVE

## 2019-03-20 LAB — QUANTIFERON-TB GOLD PLUS
Mitogen-NIL: >10 IU/mL
NIL: 0.01 IU/mL
QuantiFERON-TB Gold Plus: NEGATIVE
TB1-NIL: 0.05 IU/mL
TB2-NIL: 0.04 IU/mL

## 2019-03-20 LAB — IFE INTERPRETATION: Immunofix Electr Int: DETECTED

## 2019-03-20 LAB — IGG, IGA, IGM
IgG (Immunoglobin G), Serum: 1165 mg/dL (ref 600–1640)
IgM, Serum: 188 mg/dL (ref 50–300)
Immunoglobulin A: 402 mg/dL — ABNORMAL HIGH (ref 47–310)

## 2019-03-23 ENCOUNTER — Telehealth: Payer: Self-pay | Admitting: *Deleted

## 2019-03-23 DIAGNOSIS — R778 Other specified abnormalities of plasma proteins: Secondary | ICD-10-CM

## 2019-03-23 NOTE — Telephone Encounter (Signed)
Received notification from [employer] regarding a prior authorization for RASUVO. Authorization has been APPROVED from 03/18/19 to 03/17/20.   Ran test claim, patient's copay for 1 month supply (4 pens) is $247.13. Patient would be copay card eligible.  Phone # [phone]  12:35 PM Beatriz Chancellor, CPhT

## 2019-03-23 NOTE — Telephone Encounter (Signed)
Submitted a Prior Authorization request to [employer] for [employer] via Cover My Meds. Will update once we receive a response.

## 2019-03-23 NOTE — Telephone Encounter (Signed)
-----   Message from Ofilia Neas, PA-C sent at 03/23/2019 12:09 PM EDT ----- TB gold negative.  HIV negative.  IgA lambda monoclonal protein detected.  Please notify patient and refer to hematology for further evaluation.

## 2019-03-24 NOTE — Telephone Encounter (Signed)
Received message from [employer] asking which medication the patient was to be on, Rasuvo or Otrexup, as they have received multiple PA's. Otrexup is the cheaper option for the paitent and confirmed we need PA for Otrexup.  Representative Kristen, switched exception to correct product.  Will try test claim to check approval at another time.

## 2019-03-25 ENCOUNTER — Other Ambulatory Visit: Payer: Self-pay

## 2019-03-25 ENCOUNTER — Ambulatory Visit: Payer: Commercial Managed Care - PPO | Admitting: Rheumatology

## 2019-03-25 ENCOUNTER — Encounter: Payer: Self-pay | Admitting: Rheumatology

## 2019-03-25 ENCOUNTER — Telehealth: Payer: Self-pay | Admitting: Pharmacist

## 2019-03-25 DIAGNOSIS — G5602 Carpal tunnel syndrome, left upper limb: Secondary | ICD-10-CM | POA: Diagnosis not present

## 2019-03-25 DIAGNOSIS — L4050 Arthropathic psoriasis, unspecified: Secondary | ICD-10-CM

## 2019-03-25 MED ORDER — OTREXUP 15 MG/0.4ML ~~LOC~~ SOAJ: 15.0000 mg | SUBCUTANEOUS | 0 refills | Status: DC

## 2019-03-25 MED ORDER — TRIAMCINOLONE ACETONIDE 40 MG/ML IJ SUSP
10.0000 mg | INTRAMUSCULAR | Status: AC | PRN
  Administered 2019-03-25: 10 mg

## 2019-03-25 MED ORDER — LIDOCAINE HCL 1 % IJ SOLN
0.3000 mL | INTRAMUSCULAR | Status: AC | PRN
  Administered 2019-03-25: .3 mL

## 2019-03-25 NOTE — Telephone Encounter (Signed)
Patient should be eligible for additional funding through the manufacturer. Will follow up to see if patient has enrolled for a Cosentyx copay card. Patient has to call to enroll, phone number, [phone]. Patient just needs to call and express that his copay is still unaffordable and that he has a high deductible plan.   4:32 PM Beatriz Chancellor, CPhT

## 2019-03-25 NOTE — Telephone Encounter (Signed)
Patient seen in office for ultrasound injection. He gets his prescription filled though [facility] who assisted him with benefits investigation. They enrolled him for a co-pay card. He has a high deductible plan and he has exhausted $16,000 per year funds.  He is unable to afford co-pay of $835 a month.  Patient given sample in office today for his next dose due on 9/1.    Can you please call his insurance to see if there is a preferred agent?  I assume that he would have difficulty with all biologics so may need to look into grants or patient assistance.  Thank you,  Mariella Saa, PharmD, [place], C[facility] Clinical Specialty Pharmacist [phone]  03/25/2019 4:09 PM

## 2019-03-25 NOTE — Progress Notes (Signed)
   Procedure Note  Patient: Mitchell Morris             Date of Birth: Sep 13, 1965           MRN: [id number]             Visit Date: 03/25/2019  Procedures: Visit Diagnoses:  1. Carpal tunnel syndrome, left upper limb     Hand/UE Inj: L carpal tunnel for carpal tunnel syndrome on 03/25/2019 2:48 PM Indications: pain Details: 27 G needle, ultrasound-guided ulnar approach Medications: 10 mg triamcinolone acetonide 40 MG/ML; 0.3 mL lidocaine 1 % Aspirate: 0 mL Outcome: tolerated well, no immediate complications Procedure, treatment alternatives, risks and benefits explained, specific risks discussed. Consent was given by the patient. Immediately prior to procedure a time out was called to verify the correct patient, procedure, equipment, support staff and site/side marked as required. Patient was prepped and draped in the usual sterile fashion.    Bo Merino, MD

## 2019-03-25 NOTE — Telephone Encounter (Signed)
Ran test claim for Otrexup. Claim processed. Copay for 1 month supply is still $325.53  8:10 AM Beatriz Chancellor, CPhT

## 2019-03-25 NOTE — Progress Notes (Deleted)
   Procedure Note  Patient: Mitchell Morris             Date of Birth: [birth year]           MRN: [id number]             Visit Date: 03/25/2019  Procedures: Visit Diagnoses:  1. Carpal tunnel syndrome, left upper limb     Hand/UE Inj: L carpal tunnel for carpal tunnel syndrome on 03/25/2019 2:15 PM Indications: pain Details: 27 G needle, ultrasound-guided volar approach Medications: 0.5 mL lidocaine 1 %; 1 mL lidocaine 1 % Aspirate: 0 mL Outcome: tolerated well, no immediate complications Procedure, treatment alternatives, risks and benefits explained, specific risks discussed. Consent was given by the patient. Immediately prior to procedure a time out was called to verify the correct patient, procedure, equipment, support staff and site/side marked as required. Patient was prepped and draped in the usual sterile fashion.

## 2019-03-25 NOTE — Telephone Encounter (Signed)
Patient was given co-pay card in office and prescription sent to local pharmacy.

## 2019-03-26 ENCOUNTER — Telehealth: Payer: Self-pay | Admitting: Internal Medicine

## 2019-03-26 ENCOUNTER — Other Ambulatory Visit: Payer: Self-pay

## 2019-03-26 ENCOUNTER — Encounter: Payer: Self-pay | Admitting: Internal Medicine

## 2019-03-26 ENCOUNTER — Inpatient Hospital Stay: Payer: Commercial Managed Care - PPO | Attending: Internal Medicine | Admitting: Internal Medicine

## 2019-03-26 ENCOUNTER — Inpatient Hospital Stay: Payer: Commercial Managed Care - PPO

## 2019-03-26 VITALS — BP 151/96 | HR 107 | Temp 98.9°F | Resp 20 | Ht 72.0 in | Wt 207.1 lb

## 2019-03-26 DIAGNOSIS — R768 Other specified abnormal immunological findings in serum: Secondary | ICD-10-CM

## 2019-03-26 DIAGNOSIS — D472 Monoclonal gammopathy: Secondary | ICD-10-CM

## 2019-03-26 DIAGNOSIS — L4050 Arthropathic psoriasis, unspecified: Secondary | ICD-10-CM | POA: Insufficient documentation

## 2019-03-26 DIAGNOSIS — M25539 Pain in unspecified wrist: Secondary | ICD-10-CM

## 2019-03-26 DIAGNOSIS — Z807 Family history of other malignant neoplasms of lymphoid, hematopoietic and related tissues: Secondary | ICD-10-CM

## 2019-03-26 DIAGNOSIS — Z79899 Other long term (current) drug therapy: Secondary | ICD-10-CM | POA: Insufficient documentation

## 2019-03-26 DIAGNOSIS — Z8049 Family history of malignant neoplasm of other genital organs: Secondary | ICD-10-CM

## 2019-03-26 LAB — CBC WITH DIFFERENTIAL (CANCER CENTER ONLY)
Abs Immature Granulocytes: 0.01 10*3/uL (ref 0.00–0.07)
Basophils Absolute: 0 10*3/uL (ref 0.0–0.1)
Basophils Relative: 0 %
Eosinophils Absolute: 0 10*3/uL (ref 0.0–0.5)
Eosinophils Relative: 0 %
HCT: 43.9 % (ref 39.0–52.0)
Hemoglobin: 14.7 g/dL (ref 13.0–17.0)
Immature Granulocytes: 0 %
Lymphocytes Relative: 20 %
Lymphs Abs: 1.5 10*3/uL (ref 0.7–4.0)
MCH: 29.5 pg (ref 26.0–34.0)
MCHC: 33.5 g/dL (ref 30.0–36.0)
MCV: 88.2 fL (ref 80.0–100.0)
Monocytes Absolute: 0.6 10*3/uL (ref 0.1–1.0)
Monocytes Relative: 8 %
Neutro Abs: 5.3 10*3/uL (ref 1.7–7.7)
Neutrophils Relative %: 72 %
Platelet Count: 276 10*3/uL (ref 150–400)
RBC: 4.98 MIL/uL (ref 4.22–5.81)
RDW: 13 % (ref 11.5–15.5)
WBC Count: 7.5 10*3/uL (ref 4.0–10.5)
nRBC: 0 % (ref 0.0–0.2)

## 2019-03-26 LAB — CMP (CANCER CENTER ONLY)
ALT: 31 U/L (ref 0–44)
AST: 20 U/L (ref 15–41)
Albumin: 4.2 g/dL (ref 3.5–5.0)
Alkaline Phosphatase: 72 U/L (ref 38–126)
Anion gap: 8 (ref 5–15)
BUN: 17 mg/dL (ref 6–20)
CO2: 27 mmol/L (ref 22–32)
Calcium: 9.4 mg/dL (ref 8.9–10.3)
Chloride: 104 mmol/L (ref 98–111)
Creatinine: 1.03 mg/dL (ref 0.61–1.24)
GFR, Est AFR Am: >60 mL/min (ref >60)
GFR, Estimated: >60 mL/min (ref >60)
Glucose, Bld: 83 mg/dL (ref 70–99)
Potassium: 3.6 mmol/L (ref 3.5–5.1)
Sodium: 139 mmol/L (ref 135–145)
Total Bilirubin: 0.6 mg/dL (ref 0.3–1.2)
Total Protein: 7.9 g/dL (ref 6.5–8.1)

## 2019-03-26 LAB — LACTATE DEHYDROGENASE: LDH: 154 U/L (ref 98–192)

## 2019-03-26 NOTE — Telephone Encounter (Signed)
Received a new hem referral from Dr. Estanislado Pandy for abnl spep. Mr. Mitchell Morris has been cld and scheduled to see Dr. Julien Nordmann on 8/27 at 1:30pm. Aware to arrive 15 minutes early.

## 2019-03-26 NOTE — Telephone Encounter (Signed)
Called [employer] PAP, and patient could be eligible as long as he falls within household income guidelines. Called patient and left voicemail with these details and provided phone number to enroll over the phone [phone]. If approved, they will go ahead and set up shipment and will fax MD office for prescription.  Will follow up!  11:09 AM Beatriz Chancellor, CPhT

## 2019-03-26 NOTE — Telephone Encounter (Signed)
Called patient to get a better understanding of his situation. Patient has both the Cosentyx copay card and the Cosentyx debit card. And he has exhausted funds of both. Will reach out to come Cosentyx contacts to see if there is any other options for funding before considering switching therapies. Patient has a high deductible plan and he also is responsible for 50% of any Specialty medication.  Will follow up!  10:48 AM Beatriz Chancellor, CPhT

## 2019-03-26 NOTE — Progress Notes (Signed)
  [facility] [phone]   [phone]  CONSULT NOTE  REFERRING PHYSICIAN: Dr. 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  HPI Mitchell Morris is a 53 y.o. male with past medical history significant for psoriatic arthritis as well as left and right knee arthroplasty.  The patient was followed by his rheumatologist Dr. Deveshwar for evaluation and management of his psoriatic arthritis.  She ordered lab work on him including serum protein electrophoresis that was performed on 03/17/2019 and that showed slightly elevated IgA level of 402.  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  The patient was referred to me today for further evaluation and recommendation regarding this finding.  He is currently on treatment with methotrexate and Cosentyx.  He is feeling fine today except for some increasing pain in the wrist area suspicious for carpal tunnel.  He received steroid injection in the left hand recently by Dr. Deveshwar.  He was trying to avoid surgery to this area.  He also complains of aching pain all over his body especially in the morning but gets better as the day goes.  He has no current chest pain, shortness of breath, cough or hemoptysis.  He denied having any nausea, vomiting, diarrhea or constipation.  He has no headache or visual changes.  He has no weight loss or night sweats.  He has no bleeding, bruises or ecchymosis. Family history significant for mother with Alzheimer, father has hypertension and a brother died recently from lymphoma. The patient is married and has 2 children a son age 16 and daughter 14.  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  He has no history for smoking but drinks 2-3 alcoholic drinks every night and no history of drug abuse.  HPI  Past Medical History:  Diagnosis Date  . Psoriatic arthritis (HCC)     Past Surgical  History:  Procedure Laterality Date  . KNEE ARTHROPLASTY      Family History  Problem Relation Age of Onset  . Alzheimer's disease Mother   . Hypertension Father   . Cancer Brother        testicular   . Healthy Daughter   . Healthy Son     Social History Social History   Tobacco Use  . Smoking status: Never Smoker  . Smokeless tobacco: Never Used  Substance Use Topics  . Alcohol use: Yes    Alcohol/week: 14.0 standard drinks    Types: 14 Glasses of wine per week  . Drug use: No    No Known Allergies  Current Outpatient Medications  Medication Sig Dispense Refill  . folic acid (FOLVITE) 1 MG tablet Take 1 tablet (1 mg total) by mouth daily. 120 tablet 2  . Methotrexate, PF, (OTREXUP) 15 MG/0.4ML SOAJ Inject 15 mg into the skin once a week. 12 pen 0  . Multiple Vitamins-Minerals (MULTIVITAMIN ADULT PO) Take by mouth as needed.     . Secukinumab, 300 MG Dose, (COSENTYX SENSOREADY, 300 MG,) 150 MG/ML SOAJ Inject 150 mg into the skin every 14 (fourteen) days. 6 pen 0  . VITAMIN D PO Take 2,000 Units by mouth.     No current facility-administered medications for this visit.     Review of Systems  Constitutional: negative Eyes: negative Ears, nose, mouth, throat, and face: negative Respiratory: negative Cardiovascular: negative Gastrointestinal: negative Genitourinary:negative Integument/breast: negative Hematologic/lymphatic: negative Musculoskeletal:negative Neurological: negative Behavioral/Psych: negative   Endocrine: negative Allergic/Immunologic: negative  Physical Exam  RCB:ULAGT, healthy, no distress, well nourished and well developed SKIN: skin color, texture, turgor are normal, no rashes or significant lesions HEAD: Normocephalic, No masses, lesions, tenderness or abnormalities EYES: normal, PERRLA, Conjunctiva are pink and non-injected EARS: External ears normal, Canals clear OROPHARYNX:no exudate, no erythema and lips, buccal mucosa, and tongue  normal  NECK: supple, no adenopathy, no JVD LYMPH:  no palpable lymphadenopathy, no hepatosplenomegaly LUNGS: clear to auscultation , and palpation HEART: regular rate & rhythm, no murmurs and no gallops ABDOMEN:abdomen soft, non-tender, normal bowel sounds and no masses or organomegaly BACK: Back symmetric, no curvature., No CVA tenderness EXTREMITIES:no joint deformities, effusion, or inflammation, no edema  NEURO: alert & oriented x 3 with fluent speech, no focal motor/sensory deficits  PERFORMANCE STATUS: ECOG 0  LABORATORY DATA: Lab Results  Component Value Date   WBC 5.2 01/19/2019   HGB 15.2 01/19/2019   HCT 44.7 01/19/2019   MCV 85.8 01/19/2019   PLT 254 01/19/2019      Chemistry      Component Value Date/Time   NA 139 01/19/2019 1002   K 4.3 01/19/2019 1002   CL 101 01/19/2019 1002   CO2 28 01/19/2019 1002   BUN 15 01/19/2019 1002   CREATININE 1.15 01/19/2019 1002      Component Value Date/Time   CALCIUM 9.7 01/19/2019 1002   ALKPHOS 61 03/19/2017 1142   AST 21 01/19/2019 1002   ALT 30 01/19/2019 1002   BILITOT 0.5 01/19/2019 1002       RADIOGRAPHIC STUDIES: No results found.  ASSESSMENT: This is a very pleasant 53 years old white male years old white male presented for evaluation of suspicious monoclonal gammopathy with mildly elevated IgA level.  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   PLAN: I had a lengthy discussion with the patient today about his condition and further investigation to confirm his diagnosis.  I recommended for the patient to have a myeloma panel performed today including CBC, comprehensive metabolic panel, LDH, quantitative immunoglobulin, serum light chain and beta-2 microglobulin.  I also recommend for the patient to have 24-hour urine protein electrophoresis with immunofixation. I discussed with him proceeding with a bone marrow biopsy and aspirate her but he  would like to wait until we discussed the results of the lab work first. I will see him back for follow-up visit in 2 weeks for evaluation and more detailed discussion about his condition. The patient was advised to call immediately if he has any concerning symptoms in the interval.  The patient voices understanding of current disease status and treatment options and is in agreement with the current care plan.  All questions were answered. The patient knows to call the clinic with any problems, questions or concerns. We can certainly see the patient much sooner if necessary.  Thank you so much for allowing me to participate in the care of Mitchell Morris. I will continue to follow up the patient with you and assist in his care.  I spent 40 minutes counseling the patient face to face. The total time spent in the appointment was 60 minutes.  Disclaimer: This note was dictated with voice recognition software. Similar sounding words can inadvertently be transcribed and may not be corrected upon review.   Eilleen Kempf March 26, 2019, 2:30 PM

## 2019-03-27 LAB — IGG, IGA, IGM
IgA: 421 mg/dL — ABNORMAL HIGH (ref 90–386)
IgG (Immunoglobin G), Serum: 1221 mg/dL (ref 603–1613)
IgM (Immunoglobulin M), Srm: 197 mg/dL — ABNORMAL HIGH (ref 20–172)

## 2019-03-27 LAB — BETA 2 MICROGLOBULIN, SERUM: Beta-2 Microglobulin: 1.4 mg/L (ref 0.6–2.4)

## 2019-03-27 LAB — KAPPA/LAMBDA LIGHT CHAINS
Kappa free light chain: 13.1 mg/L (ref 3.3–19.4)
Kappa, lambda light chain ratio: 0.05 — ABNORMAL LOW (ref 0.26–1.65)
Lambda free light chains: 239 mg/L — ABNORMAL HIGH (ref 5.7–26.3)

## 2019-03-30 ENCOUNTER — Other Ambulatory Visit: Payer: Self-pay

## 2019-03-30 DIAGNOSIS — D472 Monoclonal gammopathy: Secondary | ICD-10-CM

## 2019-04-01 ENCOUNTER — Telehealth: Payer: Self-pay | Admitting: Internal Medicine

## 2019-04-01 LAB — UIFE/LIGHT CHAINS/TP QN, 24-HR UR
FR KAPPA LT CH,24HR: 44.71 mg/24 hr
FR LAMBDA LT CH,24HR: 4.45 mg/24 hr
Free Kappa Lt Chains,Ur: 54.19 mg/L (ref 0.63–113.79)
Free Kappa/Lambda Ratio: 10.05 (ref 1.03–31.76)
Free Lambda Lt Chains,Ur: 5.39 mg/L (ref 0.47–11.77)
Total Protein, Urine-Ur/day: 123 mg/24 hr (ref 30–150)
Total Protein, Urine: 14.9 mg/dL
Total Volume: 825

## 2019-04-01 NOTE — Telephone Encounter (Signed)
Called to confirm appt on  9/10 per 8/27 los . Pt aware

## 2019-04-02 NOTE — Telephone Encounter (Signed)
Called patient, he called [employer] and they requested him to download the online application. Patient sent in application and is awaiting determination. [employer], patient's application was not on file. Called patient back to see if he can resubmit application with proof of income- had to leave a message. Advised patient he can bring documents to office to have them faxed.   Will follow up.

## 2019-04-08 NOTE — Telephone Encounter (Signed)
[employer], and they still do not see patient's application on file. Left patient a message to follow up on application and proof of income. Advised he could bring to the office to resubmit, if needed.  Phone# [phone]  9:22 AM Beatriz Chancellor, CPhT

## 2019-04-09 ENCOUNTER — Other Ambulatory Visit: Payer: Self-pay

## 2019-04-09 ENCOUNTER — Inpatient Hospital Stay: Payer: Commercial Managed Care - PPO | Attending: Internal Medicine | Admitting: Physician Assistant

## 2019-04-09 ENCOUNTER — Encounter: Payer: Self-pay | Admitting: Physician Assistant

## 2019-04-09 VITALS — BP 150/95 | HR 73 | Temp 98.9°F | Resp 20 | Ht 72.0 in | Wt 204.2 lb

## 2019-04-09 DIAGNOSIS — R030 Elevated blood-pressure reading, without diagnosis of hypertension: Secondary | ICD-10-CM | POA: Diagnosis not present

## 2019-04-09 DIAGNOSIS — D472 Monoclonal gammopathy: Secondary | ICD-10-CM | POA: Insufficient documentation

## 2019-04-09 DIAGNOSIS — R768 Other specified abnormal immunological findings in serum: Secondary | ICD-10-CM | POA: Insufficient documentation

## 2019-04-09 DIAGNOSIS — L4050 Arthropathic psoriasis, unspecified: Secondary | ICD-10-CM | POA: Diagnosis not present

## 2019-04-09 NOTE — Telephone Encounter (Signed)
Patient dropped off [employer] PAP application.  Will have Dr. Estanislado Pandy sign provider portion and fax when complete.  Will update when we receive a response.    Mariella Saa, PharmD, [place], [place] Clinical [facility]ty Pharmacist [phone]  04/09/2019 12:05 PM

## 2019-04-09 NOTE — Progress Notes (Signed)
[place] OFFICE PROGRESS NOTE  Kathyrn Lass, MD [place] [address]  DIAGNOSIS: suspicious monoclonal gammopathy with mildly elevated IgA level.  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  PRIOR THERAPY: None  CURRENT THERAPY: None  INTERVAL HISTORY: Mitchell Morris 53 y.o. male returns to the clinic for a follow up visit. The patient was initially referred to the clinic by his rheumatologist who manages his psoriatic arthritis.  The patient had lab work drawn by his rheumatologist that included a serum protein electrophoresis that showed an elevated IgA of 402 and immuno fixation showed the presence of an IgA lambda monoclonal protein. 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  The patient is feeling well today without any concerning complaints.  He denies any fever, chills, night sweats, or weight loss.  He denies any chest pain, shortness of breath, cough, or hemoptysis.  He denies any nausea, vomiting, diarrhea, or constipation.  He denies any headache or visual changes.  Denies any bleeding or bruising. The patient denies any pain except for pain associated with his psoriatic arthritis. The patient recently had multiple lab studies performed for further evaluation of his condition including a CBC, CMP, LDH, quantitative immunoglobulin, kappa/lambda serum light chain, and beta-2 microglobulin, 24-hour urine protein electrophoresis with immunofixation.  He is here today for evaluation and to review his lab work and recommendations.  MEDICAL HISTORY: Past Medical History:  Diagnosis Date  . Psoriatic arthritis ([place])     ALLERGIES:  has No Known Allergies.  MEDICATIONS:  Current Outpatient Medications  Medication Sig Dispense Refill  . folic acid (FOLVITE) 1 MG tablet Take 1 tablet (1 mg total) by mouth daily. 120 tablet 2   . Methotrexate, PF, (OTREXUP) 15 MG/0.4ML SOAJ Inject 15 mg into the skin once a week. 12 pen 0  . Multiple Vitamins-Minerals (MULTIVITAMIN ADULT PO) Take by mouth as needed.     . Secukinumab, 300 MG Dose, (COSENTYX SENSOREADY, 300 MG,) 150 MG/ML SOAJ Inject 150 mg into the skin every 14 (fourteen) days. 6 pen 0  . VITAMIN D PO Take 2,000 Units by mouth.     No current facility-administered medications for this visit.     SURGICAL HISTORY:  Past Surgical History:  Procedure Laterality Date  . KNEE ARTHROPLASTY      REVIEW OF SYSTEMS:   Review of Systems  Constitutional: Negative for appetite change, chills, fatigue, fever and unexpected weight change.  HENT:   Negative for mouth sores, nosebleeds, sore throat and trouble swallowing.   Eyes: Negative for eye problems and icterus.  Respiratory: Negative for cough, hemoptysis, shortness of breath and wheezing.   Cardiovascular: Negative for chest pain and leg swelling.  Gastrointestinal: Negative for abdominal pain, constipation, diarrhea, nausea and vomiting.  Genitourinary: Negative for bladder incontinence, difficulty urinating, dysuria, frequency and hematuria.   Musculoskeletal: Negative for gait problem, neck pain and neck stiffness.  Skin: Negative for itching and rash.  Neurological: Negative for dizziness, extremity weakness, gait problem, headaches, light-headedness and seizures.  Hematological: Negative for adenopathy. Does not bruise/bleed easily.  Psychiatric/Behavioral: Negative for confusion, depression and sleep disturbance. The patient is not nervous/anxious.     PHYSICAL EXAMINATION:  Blood pressure (!) 150/95, pulse 73, temperature 98.9 F (37.2 C), temperature source Mitchell, resp. rate 20, height 6' (1.829 m), weight 204 lb 3.2 oz (92.6 kg), SpO2 98 %.  ECOG  PERFORMANCE STATUS: 0 - Asymptomatic  Physical Exam  Constitutional: Oriented to person, place, and time and well-developed, well-nourished, and in no  distress. Marland Kitchen  HENT:  Head: Normocephalic and atraumatic.  Mouth/Throat: Oropharynx is clear and moist. No oropharyngeal exudate.  Eyes: Conjunctivae are normal. Right eye exhibits no discharge. Left eye exhibits no discharge. No scleral icterus.  Neck: Normal range of motion. Neck supple.  Cardiovascular: Normal rate, regular rhythm, normal heart sounds and intact distal pulses.   Pulmonary/Chest: Effort normal and breath sounds normal. No respiratory distress. No wheezes. No rales.  Abdominal: Soft. Bowel sounds are normal. Exhibits no distension and no mass. There is no tenderness.  Musculoskeletal: Normal range of motion. Exhibits no edema.  Lymphadenopathy:    No cervical adenopathy.  Neurological: Alert and oriented to person, place, and time. Exhibits normal muscle tone. Gait normal. Coordination normal.  Skin: Skin is warm and dry. No rash noted. Not diaphoretic. No erythema. No pallor.  Psychiatric: Mood, memory and judgment normal.  Vitals reviewed.  LABORATORY DATA: Lab Results  Component Value Date   WBC 7.5 03/26/2019   HGB 14.7 03/26/2019   HCT 43.9 03/26/2019   MCV 88.2 03/26/2019   PLT 276 03/26/2019      Chemistry      Component Value Date/Time   NA 139 03/26/2019 1458   K 3.6 03/26/2019 1458   CL 104 03/26/2019 1458   CO2 27 03/26/2019 1458   BUN 17 03/26/2019 1458   CREATININE 1.03 03/26/2019 1458   CREATININE 1.15 01/19/2019 1002      Component Value Date/Time   CALCIUM 9.4 03/26/2019 1458   ALKPHOS 72 03/26/2019 1458   AST 20 03/26/2019 1458   ALT 31 03/26/2019 1458   BILITOT 0.6 03/26/2019 1458       RADIOGRAPHIC STUDIES:  No results found.   ASSESSMENT/PLAN:  This is a very pleasant 53 year old Caucasian male who is being evaluated of suspicious monoclonal gammopathy with mildly elevated IgA but multiple myeloma cannot be ruled out at this point.   The patient is here today to discuss his lab work including a CBC, CMP, LDH, beta 2  microglobulin, kappa/lambda serum light chain, and a 24 hour urine protein electrophoresis with immunofixation.   The patient seen with Dr. Julien Nordmann today.  Dr. Julien Nordmann had a lengthy discussion with the patient today about his lab work.  The patient's labs are unremarkable except for an elevated lambda free light chains at 239.  Dr. Julien Nordmann recommends that the patient have a bone marrow biopsy  To further evaluate his condition and to rule out any evidence of multiple myeloma.  We will arrange for the patient to have the bone marrow biopsy performed in approximately 1 week or so.   We will see the patient back in approximately 2 weeks to review his biopsy results.   The patient was advised to call immediately if he has any concerning symptoms in the interval. The patient voices understanding of current disease status and treatment options and is in agreement with the current care plan. All questions were answered. The patient knows to call the clinic with any problems, questions or concerns. We can certainly see the patient much sooner if necessary  Orders Placed This Encounter  Procedures  . CMP ([place] only)    Standing Status:   Future    Standing Expiration Date:   04/08/2020  . CBC with Differential (Cancer Center Only)    Standing Status:   Future  Standing Expiration Date:   04/08/2020     Tobe Sos Heilingoetter, PA-C 04/09/19  ADDENDUM: Hematology/Oncology Attending: I had a face-to-face encounter with the patient today.  I recommended his care plan.  This is a very pleasant 53 years old white male with suspicious monoclonal gammopathy with elevated IgA as well as free lambda light chain. His 24-hour urine protein electrophoresis showed no monoclonal gammopathy. I recommended for the patient to proceed with a bone marrow biopsy and aspirate to rule out any underlying myeloproliferative disorder/multiple myeloma responsible for the elevated lambda light chain. We will  arrange for the patient to come back for follow-up visit 1 week after his biopsy for evaluation and discussion of the biopsy results and further recommendation regarding his condition. He was advised to call immediately if he has any concerning symptoms in the interval.  Disclaimer: This note was dictated with voice recognition software. Similar sounding words can inadvertently be transcribed and may be missed upon review. Eilleen Kempf, MD 04/09/19

## 2019-04-10 ENCOUNTER — Telehealth: Payer: Self-pay | Admitting: Adult Health

## 2019-04-10 ENCOUNTER — Telehealth: Payer: Self-pay

## 2019-04-10 NOTE — Telephone Encounter (Signed)
Added bmbx 9/16 per 9/11 schedule message. Date/time/pt aware and lab after bmbx per message. Hold already added to [facility] schedule.

## 2019-04-10 NOTE — Telephone Encounter (Signed)
Spoke with patient to schedule BMBX with [facility].  Schedule message sent high priority for 04/15/19 for 7:30 am.  Call made to cytometry with date/time.  Patient aware.

## 2019-04-13 NOTE — Telephone Encounter (Signed)
Submitted Patient Assistance Application to [employer] for [employer]. Will follow up to confirm receipt.   Will send documents to scan center.  Fax# [phone] Phone# [phone]   2:43 PM Beatriz Chancellor, CPhT

## 2019-04-15 ENCOUNTER — Telehealth: Payer: Self-pay | Admitting: Physician Assistant

## 2019-04-15 ENCOUNTER — Inpatient Hospital Stay (HOSPITAL_BASED_OUTPATIENT_CLINIC_OR_DEPARTMENT_OTHER): Payer: Commercial Managed Care - PPO | Admitting: Adult Health

## 2019-04-15 ENCOUNTER — Other Ambulatory Visit: Payer: Self-pay

## 2019-04-15 ENCOUNTER — Inpatient Hospital Stay: Payer: Commercial Managed Care - PPO

## 2019-04-15 VITALS — BP 145/94 | HR 61 | Temp 98.4°F | Resp 16

## 2019-04-15 DIAGNOSIS — D472 Monoclonal gammopathy: Secondary | ICD-10-CM

## 2019-04-15 LAB — CBC WITH DIFFERENTIAL (CANCER CENTER ONLY)
Abs Immature Granulocytes: 0.01 10*3/uL (ref 0.00–0.07)
Basophils Absolute: 0 10*3/uL (ref 0.0–0.1)
Basophils Relative: 0 %
Eosinophils Absolute: 0 10*3/uL (ref 0.0–0.5)
Eosinophils Relative: 1 %
HCT: 43.3 % (ref 39.0–52.0)
Hemoglobin: 14.5 g/dL (ref 13.0–17.0)
Immature Granulocytes: 0 %
Lymphocytes Relative: 24 %
Lymphs Abs: 1 10*3/uL (ref 0.7–4.0)
MCH: 30 pg (ref 26.0–34.0)
MCHC: 33.5 g/dL (ref 30.0–36.0)
MCV: 89.5 fL (ref 80.0–100.0)
Monocytes Absolute: 0.4 10*3/uL (ref 0.1–1.0)
Monocytes Relative: 8 %
Neutro Abs: 2.8 10*3/uL (ref 1.7–7.7)
Neutrophils Relative %: 67 %
Platelet Count: 241 10*3/uL (ref 150–400)
RBC: 4.84 MIL/uL (ref 4.22–5.81)
RDW: 13.3 % (ref 11.5–15.5)
WBC Count: 4.3 10*3/uL (ref 4.0–10.5)
nRBC: 0 % (ref 0.0–0.2)

## 2019-04-15 MED ORDER — LIDOCAINE HCL 2 % IJ SOLN
INTRAMUSCULAR | Status: AC
  Filled 2019-04-15: qty 20

## 2019-04-15 NOTE — Telephone Encounter (Signed)
[employer] is requesting income documents and copy of insurance card for application.Left message for patient to bring income documents to office. Also left fax number in case he has access to fax.   11:52 AM Beatriz Chancellor, CPhT

## 2019-04-15 NOTE — Telephone Encounter (Signed)
Scheduled appt per 9/10 los and 9/16 sch message - pt to get an updated schedule at lab today after BMBX - spoke with LAB and they will print him a schedule.

## 2019-04-15 NOTE — Progress Notes (Signed)
Pt's dressing dry & intact, VS WNL, DC'd home in stable condition.  DC instructions given & discussed.

## 2019-04-15 NOTE — Progress Notes (Signed)
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  The patient's name, date of birth, identification, consent and allergies were verified prior to the start of procedure and time out was performed.  The left posterior iliac crest was chosen as the site of biopsy.  The skin was prepped with ChloraPrep.   10 cc of 2% lidocaine was used to provide local anaesthesia.   10 cc of bone marrow aspirate was obtained followed by 1cm biopsy.  Pressure was applied to the biopsy site and bandage was placed over the biopsy site. Patient was made to lie on the back for 30 mins prior to discharge.  The procedure was tolerated well. COMPLICATIONS: None BLOOD LOSS: none The patient was discharged home in stable condition with a 1 week follow up to review results.  Patient was provided with post bone marrow biopsy instructions and instructed to call if there was any bleeding or worsening pain.  Specimens sent for flow cytometry, cytogenetics and additional studies.  Signed  C , NP   

## 2019-04-15 NOTE — Patient Instructions (Signed)

## 2019-04-16 ENCOUNTER — Other Ambulatory Visit: Payer: Self-pay

## 2019-04-16 ENCOUNTER — Telehealth: Payer: Self-pay | Admitting: Physician Assistant

## 2019-04-16 NOTE — Telephone Encounter (Signed)
I left a message regarding schedule  

## 2019-04-21 LAB — SURGICAL PATHOLOGY

## 2019-04-22 ENCOUNTER — Other Ambulatory Visit: Payer: Commercial Managed Care - PPO

## 2019-04-22 ENCOUNTER — Encounter: Payer: Self-pay | Admitting: Physician Assistant

## 2019-04-22 ENCOUNTER — Inpatient Hospital Stay: Payer: Commercial Managed Care - PPO | Admitting: Physician Assistant

## 2019-04-22 ENCOUNTER — Other Ambulatory Visit: Payer: Self-pay

## 2019-04-22 VITALS — BP 140/92 | HR 100 | Temp 98.5°F | Resp 18 | Ht 72.0 in | Wt 205.7 lb

## 2019-04-22 DIAGNOSIS — R030 Elevated blood-pressure reading, without diagnosis of hypertension: Secondary | ICD-10-CM

## 2019-04-22 DIAGNOSIS — D472 Monoclonal gammopathy: Secondary | ICD-10-CM | POA: Diagnosis not present

## 2019-04-22 DIAGNOSIS — Z7189 Other specified counseling: Secondary | ICD-10-CM | POA: Insufficient documentation

## 2019-04-22 NOTE — Progress Notes (Signed)
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   PRIOR THERAPY: None  CURRENT THERAPY: Observation   INTERVAL HISTORY: Mitchell Morris 53 y.o. male returns to the clinic for a follow-up visit.  The patient is feeling well today without any concerning complaints. He denies any fever, chills, night sweats, or weight loss.  He denies any chest pain, shortness of breath, cough, or hemoptysis.  He denies any nausea, vomiting, diarrhea, or constipation.  He denies any headache or visual changes.  Denies any bleeding or bruising. The patient denies any pain except for pain associated with his psoriatic arthritis.  he patient was referred to clinic by his endocrinologist who manages his psoriatic arthritis.  He was found to have abnormal serum protein electrophoresis which showed an elevated IgA.  He then had lab work performed with a CBC, CMP, LDH, quantitative immunoglobulin, kappa/lambda light chains, beta 2 serum microglobulin, serum protein electrophoresis with immunofixation.  The patient recently had a bone marrow biopsy and aspirate performed to rule out any underlying myeloproliferative disorder/multiple myeloma responsible for the elevated lambda light chains. He tolerated the procedure well with mild soreness.The patient is here today to review the biopsy results and to discuss his condition.  MEDICAL HISTORY: Past Medical History:  Diagnosis Date  . Psoriatic arthritis ([place])     ALLERGIES:  has No Known Allergies.  MEDICATIONS:  Current Outpatient Medications  Medication Sig Dispense Refill  . folic acid (FOLVITE) 1 MG tablet Take 1 tablet (1 mg total) by mouth daily. 120 tablet 2  . Methotrexate, PF, (OTREXUP) 15 MG/0.4ML SOAJ Inject 15 mg into the skin once a week. 12 pen 0  . Multiple  Vitamins-Minerals (MULTIVITAMIN ADULT PO) Take by mouth as needed.     . Secukinumab, 300 MG Dose, (COSENTYX SENSOREADY, 300 MG,) 150 MG/ML SOAJ Inject 150 mg into the skin every 14 (fourteen) days. 6 pen 0  . VITAMIN D PO Take 2,000 Units by mouth.     No current facility-administered medications for this visit.     SURGICAL HISTORY:  Past Surgical History:  Procedure Laterality Date  . KNEE ARTHROPLASTY      REVIEW OF SYSTEMS:   Review of Systems  Constitutional: Negative for appetite change, chills, fatigue, fever and unexpected weight change.  HENT: Negative for mouth sores, nosebleeds, sore throat and trouble swallowing.   Eyes: Negative for eye problems and icterus.  Respiratory: Negative for cough, hemoptysis, shortness of breath and wheezing.   Cardiovascular: Negative for chest pain and leg swelling.  Gastrointestinal: Negative for abdominal pain, constipation, diarrhea, nausea and vomiting.  Genitourinary: Negative for bladder incontinence, difficulty urinating, dysuria, frequency and hematuria.   Musculoskeletal: Negative for back pain, gait problem, neck pain and neck stiffness.  Skin: Negative for itching and rash.  Neurological: Negative for dizziness, extremity weakness, gait problem, headaches, light-headedness and seizures.  Hematological: Negative for adenopathy. Does not bruise/bleed easily.  Psychiatric/Behavioral: Negative for confusion, depression and sleep disturbance. The patient is not nervous/anxious.     PHYSICAL EXAMINATION:  Blood pressure (!) 140/92, pulse 100, temperature 98.5 F (36.9 C), temperature source Oral, resp. rate 18, height 6' (1.829 m), weight 205 lb 11.2 oz (93.3 kg), SpO2 99 %.  ECOG PERFORMANCE STATUS: 0 - Asymptomatic  Physical Exam  Constitutional: Oriented to person, place, and time and well-developed, well-nourished, and  in no distress.  HENT:  Head: Normocephalic and atraumatic.  Mouth/Throat: Oropharynx is clear and moist. No  oropharyngeal exudate.  Eyes: Conjunctivae are normal. Right eye exhibits no discharge. Left eye exhibits no discharge. No scleral icterus.  Neck: Normal range of motion. Neck supple.  Cardiovascular: Normal rate, regular rhythm, normal heart sounds and intact distal pulses.   Pulmonary/Chest: Effort normal and breath sounds normal. No respiratory distress. No wheezes. No rales.  Abdominal: Soft. Bowel sounds are normal. Exhibits no distension and no mass. There is no tenderness.  Musculoskeletal: Normal range of motion. Exhibits no edema.  Lymphadenopathy:    No cervical adenopathy.  Neurological: Alert and oriented to person, place, and time. Exhibits normal muscle tone. Gait normal. Coordination normal.  Skin: Skin is warm and dry. No rash noted. Not diaphoretic. No erythema. No pallor.  Psychiatric: Mood, memory and judgment normal.  Vitals reviewed.  LABORATORY DATA: Lab Results  Component Value Date   WBC 4.3 04/15/2019   HGB 14.5 04/15/2019   HCT 43.3 04/15/2019   MCV 89.5 04/15/2019   PLT 241 04/15/2019      Chemistry      Component Value Date/Time   NA 139 03/26/2019 1458   K 3.6 03/26/2019 1458   CL 104 03/26/2019 1458   CO2 27 03/26/2019 1458   BUN 17 03/26/2019 1458   CREATININE 1.03 03/26/2019 1458   CREATININE 1.15 01/19/2019 1002      Component Value Date/Time   CALCIUM 9.4 03/26/2019 1458   ALKPHOS 72 03/26/2019 1458   AST 20 03/26/2019 1458   ALT 31 03/26/2019 1458   BILITOT 0.6 03/26/2019 1458       RADIOGRAPHIC STUDIES:  No results found.   ASSESSMENT/PLAN:  This is a very pleasant 53 year old Caucasian male with monoclonal gammopathy of undetermined significance with elevated IgA as well as free lambda light chains.  The patient recently had a bone marrow biopsy and aspirate performed to rule out any underlying myeloproliferative disorder/multiple myeloma.  He is here today to review his results. The patient was seen today with Dr. Julien Nordmann.    Dr. Julien Nordmann had a discussion with the patient today about the results of his bone marrow biopsy and aspirate. The biopsy did not show any evidence of a myeloproliferative disorder/multiple myeloma.    Dr. Julien Nordmann recommends that the patient continue on observation with a repeat CBC, CMP, LDH, Kappa/ambda light chain, quant. Immunoglobulin, serum beta-2 microglobulin in 1 year.   We will see the patient back for a follow up visit in 1 year to review his lab results.  The patient's blood pressure was noted to be elevated again while in clinic today.  The patient attributes his elevated blood pressure being related to being anxious about his biopsy results.  The patient was advised to continue to monitor his blood pressure at home and to keep a log of his readings.  If the patient's blood pressure continues to be elevated, he was instructed to follow-up with his primary care provider for consideration of hypertensive therapy.   The patient was advised to call immediately if he has any concerning symptoms in the interval. The patient voices understanding of current disease status and treatment options and is in agreement with the current care plan. All questions were answered. The patient knows to call the clinic with any problems, questions or concerns. We can certainly see the patient much sooner if necessary   Orders Placed This Encounter  Procedures  . CMP ([facility]  only)    Standing Status:   Future    Standing Expiration Date:   04/21/2020  . CBC with Differential ([facility] Only)    Standing Status:   Future    Standing Expiration Date:   04/21/2020  . Lactate dehydrogenase (LDH)    Standing Status:   Future    Standing Expiration Date:   04/21/2020  . Beta 2 microglobulin    Standing Status:   Future    Standing Expiration Date:   04/21/2020  . QIG  (Quant. immunoglobulins  - IgG, IgA, IgM)    Standing Status:   Future    Standing Expiration Date:   04/21/2020  .  Kappa/lambda light chains    Standing Status:   Future    Standing Expiration Date:   04/21/2020     Tobe Sos Heilingoetter, PA-C 04/22/19  ADDENDUM: Hematology/Oncology Attending: I had a face-to-face encounter with the patient today.  I recommended his care plan.  This is a very pleasant 53 years old white male who presented for evaluation of suspicious monoclonal gammopathy of undetermined significance with elevated IgA as well as free lambda light chain.  The patient had urine protein electrophoresis with immunofixation that was unremarkable and showed normal pattern. He underwent a bone marrow biopsy and aspirate recently. I discussed the biopsy results with the patient and his wife who was available by phone today. His biopsy showed no concerning findings for plasma cell dyscrasia.  His condition could be secondary to her inflammatory process. I recommended for the patient to continue on observation with repeat myeloma panel in 1 year. The patient and his wife agreed to the current plan.  He was advised to call immediately if he has any concerning symptoms in the interval.  Disclaimer: This note was dictated with voice recognition software. Similar sounding words can inadvertently be transcribed and may be missed upon review. Eilleen Kempf, MD 04/22/19

## 2019-04-23 ENCOUNTER — Telehealth: Payer: Self-pay | Admitting: Internal Medicine

## 2019-04-23 NOTE — Telephone Encounter (Signed)
Scheduled appt per 9/23 los - mailed reminder letter with appt date and time   

## 2019-04-24 ENCOUNTER — Encounter (HOSPITAL_COMMUNITY): Payer: Self-pay | Admitting: Internal Medicine

## 2019-04-27 NOTE — Telephone Encounter (Signed)
[employer], they are still waiting on proof of income. Called patient, left message.

## 2019-05-04 NOTE — Telephone Encounter (Signed)
Left message for patient to follow up on income documents required for PAP.  12:16 PM Beatriz Chancellor, CPhT

## 2019-05-09 ENCOUNTER — Other Ambulatory Visit: Payer: Self-pay | Admitting: Rheumatology

## 2019-05-11 NOTE — Telephone Encounter (Addendum)
Now on injectable MTX

## 2019-05-13 NOTE — Telephone Encounter (Signed)
Received notification from [employer] that patient has been approved for Cosentyx through 07/30/2019.  They will reach out to him about scheduling shipment.  Mariella Saa, PharmD, [place], [facility] Clinical Specialty Pharmacist [phone]  05/13/2019 4:32 PM

## 2019-05-21 ENCOUNTER — Ambulatory Visit: Payer: Commercial Managed Care - PPO | Admitting: Physician Assistant

## 2019-05-25 ENCOUNTER — Other Ambulatory Visit: Payer: Self-pay | Admitting: Rheumatology

## 2019-05-25 DIAGNOSIS — L4050 Arthropathic psoriasis, unspecified: Secondary | ICD-10-CM

## 2019-05-29 ENCOUNTER — Other Ambulatory Visit: Payer: Self-pay

## 2019-05-29 DIAGNOSIS — Z20822 Contact with and (suspected) exposure to covid-19: Secondary | ICD-10-CM

## 2019-05-30 ENCOUNTER — Other Ambulatory Visit: Payer: Self-pay

## 2019-05-30 ENCOUNTER — Emergency Department (HOSPITAL_COMMUNITY): Payer: Commercial Managed Care - PPO

## 2019-05-30 ENCOUNTER — Emergency Department (HOSPITAL_COMMUNITY)
Admission: EM | Admit: 2019-05-30 | Discharge: 2019-05-30 | Disposition: A | Discharge status: Home / Self Care | Payer: Commercial Managed Care - PPO | Attending: Emergency Medicine | Admitting: Emergency Medicine

## 2019-05-30 DIAGNOSIS — Z79899 Other long term (current) drug therapy: Secondary | ICD-10-CM | POA: Diagnosis not present

## 2019-05-30 DIAGNOSIS — S0992XA Unspecified injury of nose, initial encounter: Secondary | ICD-10-CM

## 2019-05-30 DIAGNOSIS — Y939 Activity, unspecified: Secondary | ICD-10-CM | POA: Insufficient documentation

## 2019-05-30 DIAGNOSIS — Y999 Unspecified external cause status: Secondary | ICD-10-CM | POA: Insufficient documentation

## 2019-05-30 DIAGNOSIS — W1811XA Fall from or off toilet without subsequent striking against object, initial encounter: Secondary | ICD-10-CM | POA: Diagnosis not present

## 2019-05-30 DIAGNOSIS — R55 Syncope and collapse: Secondary | ICD-10-CM | POA: Insufficient documentation

## 2019-05-30 DIAGNOSIS — S299XXA Unspecified injury of thorax, initial encounter: Secondary | ICD-10-CM | POA: Insufficient documentation

## 2019-05-30 DIAGNOSIS — Y929 Unspecified place or not applicable: Secondary | ICD-10-CM | POA: Insufficient documentation

## 2019-05-30 LAB — CBC
HCT: 46 % (ref 39.0–52.0)
Hemoglobin: 15.1 g/dL (ref 13.0–17.0)
MCH: 30.4 pg (ref 26.0–34.0)
MCHC: 32.8 g/dL (ref 30.0–36.0)
MCV: 92.6 fL (ref 80.0–100.0)
Platelets: 225 10*3/uL (ref 150–400)
RBC: 4.97 MIL/uL (ref 4.22–5.81)
RDW: 13.8 % (ref 11.5–15.5)
WBC: 6.2 10*3/uL (ref 4.0–10.5)
nRBC: 0 % (ref 0.0–0.2)

## 2019-05-30 LAB — COMPREHENSIVE METABOLIC PANEL
ALT: 44 U/L (ref 0–44)
AST: 33 U/L (ref 15–41)
Albumin: 4.3 g/dL (ref 3.5–5.0)
Alkaline Phosphatase: 64 U/L (ref 38–126)
Anion gap: 11 (ref 5–15)
BUN: 18 mg/dL (ref 6–20)
CO2: 24 mmol/L (ref 22–32)
Calcium: 9.4 mg/dL (ref 8.9–10.3)
Chloride: 103 mmol/L (ref 98–111)
Creatinine, Ser: 1.04 mg/dL (ref 0.61–1.24)
GFR calc Af Amer: >60 mL/min (ref >60)
GFR calc non Af Amer: >60 mL/min (ref >60)
Glucose, Bld: 116 mg/dL — ABNORMAL HIGH (ref 70–99)
Potassium: 3.9 mmol/L (ref 3.5–5.1)
Sodium: 138 mmol/L (ref 135–145)
Total Bilirubin: 0.7 mg/dL (ref 0.3–1.2)
Total Protein: 8.2 g/dL — ABNORMAL HIGH (ref 6.5–8.1)

## 2019-05-30 LAB — ETHANOL: Alcohol, Ethyl (B): <10 mg/dL (ref <10)

## 2019-05-30 MED ORDER — SODIUM CHLORIDE 0.9 % IV BOLUS (SEPSIS)
1000.0000 mL | Freq: Once | INTRAVENOUS | Status: AC
  Administered 2019-05-30: 1000 mL via INTRAVENOUS

## 2019-05-30 MED ORDER — HYDROCODONE-ACETAMINOPHEN 5-325 MG PO TABS: 1.0000 | ORAL_TABLET | ORAL | 0 refills | Status: DC | PRN

## 2019-05-30 MED ORDER — ACETAMINOPHEN 325 MG PO TABS
ORAL_TABLET | ORAL | Status: AC
  Filled 2019-05-30: qty 2

## 2019-05-30 MED ORDER — NAPROXEN 500 MG PO TABS: 500.0000 mg | ORAL_TABLET | Freq: Two times a day (BID) | ORAL | 0 refills | Status: DC

## 2019-05-30 MED ORDER — SODIUM CHLORIDE 0.9 % IV SOLN: 1000.0000 mL | INTRAVENOUS | Status: DC

## 2019-05-30 MED ORDER — KETOROLAC TROMETHAMINE 30 MG/ML IJ SOLN
30.0000 mg | Freq: Once | INTRAMUSCULAR | Status: AC
  Administered 2019-05-30: 30 mg via INTRAVENOUS
  Filled 2019-05-30: qty 1

## 2019-05-30 MED ORDER — SODIUM CHLORIDE 0.9 % IV SOLN
INTRAVENOUS | Status: AC
  Filled 2019-05-30: qty 500

## 2019-05-30 MED ORDER — SODIUM CHLORIDE 0.9 % IV SOLN
INTRAVENOUS | Status: AC
  Filled 2019-05-30: qty 20

## 2019-05-30 MED ORDER — BACITRACIN ZINC 500 UNIT/GM EX OINT
1.0000 "application " | TOPICAL_OINTMENT | Freq: Once | CUTANEOUS | Status: AC
  Administered 2019-05-30: 1 via TOPICAL
  Filled 2019-05-30: qty 0.9

## 2019-05-30 MED ORDER — MORPHINE SULFATE (PF) 4 MG/ML IV SOLN
4.0000 mg | Freq: Once | INTRAVENOUS | Status: AC
  Administered 2019-05-30: 4 mg via INTRAVENOUS
  Filled 2019-05-30: qty 1

## 2019-05-30 NOTE — ED Notes (Signed)
Axillary temperature recorded due to pt chewing on ice.

## 2019-05-30 NOTE — ED Triage Notes (Signed)
Patient reports he fell was on toilet and passed out and fell out toilet. Injured right ribs. Pain 10/10, described as sharp. Patient also notes he was tested for COVID yesterday and result pending. Patient reports fever.

## 2019-05-30 NOTE — ED Notes (Signed)
Pt calling son for ride.

## 2019-05-30 NOTE — ED Notes (Signed)
Patient is aware that urine sample is needed. Urinal at bedside. 

## 2019-05-30 NOTE — ED Provider Notes (Signed)
[facility] DEPT Provider Note   CSN: [id number] Arrival date & time: 05/30/19  1123     History   Chief Complaint Chief Complaint  Patient presents with  . Loss of Consciousness  . Chest Pain    right ribs    HPI Mitchell Morris is a 53 y.o. male.     HPI Pt started having a fever a couple of days ago.  He went and got outpt covid testing.  Last night he started having cramping abd pain.  He had a syncopal episode and fell off the toilet.  Pt fell and injured his ribs.  Pt called his doctor and was instructed to come to the ED.  Pt had another episode this morning of syncope.  He feels lightheaded whenever he sits up.  He has had nasal congestion for about a week.  No trouble with smell or taste.  No coughing.   No diarrhea.  No diarrhea.   Past Medical History:  Diagnosis Date  . Psoriatic arthritis [facility])     Patient Active Problem List   Diagnosis Date Noted  . Goals of care, counseling/discussion 04/22/2019  . Elevated blood pressure reading 04/22/2019  . MGUS (monoclonal gammopathy of unknown significance) 03/26/2019  . Primary osteoarthritis of both hands 03/12/2017  . Primary osteoarthritis of both feet 03/12/2017  . DDD (degenerative disc disease), lumbar 03/12/2017  . Alcohol use/ keep Methotrexate dose low  03/12/2017  . Psoriatic arthritis ([place]) 09/27/2016  . Psoriasis 09/27/2016  . High risk medication use 09/27/2016    Past Surgical History:  Procedure Laterality Date  . KNEE ARTHROPLASTY          Home Medications    Prior to Admission medications   Medication Sig Start Date End Date Taking? Authorizing Provider  folic acid (FOLVITE) 1 MG tablet Take 1 tablet (1 mg total) by mouth daily. 07/01/18  Yes Ofilia Neas, PA-C  losartan (COZAAR) 50 MG tablet Take 50 mg by mouth daily. 05/06/19  Yes [provider]  Methotrexate, PF, (OTREXUP) 15 MG/0.4ML SOAJ Inject 15 mg into the skin once a week. 03/25/19  Yes  Deveshwar, Abel Presto, MD  Multiple Vitamins-Minerals (MULTIVITAMIN ADULT PO) Take 1 tablet by mouth daily.    Yes [provider]  Phenyleph-Doxylamine-DM-APAP (NYQUIL SEVERE COLD/FLU) 5-6.25-10-325 MG/15ML LIQD Take 15 mLs by mouth every 6 (six) hours as needed (sleep, congestion).   Yes [provider]  Secukinumab, 300 MG Dose, (COSENTYX SENSOREADY, 300 MG,) 150 MG/ML SOAJ Inject 150 mg into the skin every 14 (fourteen) days. 03/17/19  Yes Deveshwar, Abel Presto, MD  VITAMIN D PO Take 1 tablet by mouth daily.    Yes [provider]  HYDROcodone-acetaminophen (NORCO/VICODIN) 5-325 MG tablet Take 1 tablet by mouth every 4 (four) hours as needed. 05/30/19   Dorie Rank, MD  naproxen (NAPROSYN) 500 MG tablet Take 1 tablet (500 mg total) by mouth 2 (two) times daily with a meal. As needed for pain 05/30/19   Dorie Rank, MD    Family History Family History  Problem Relation Age of Onset  . Alzheimer's disease Mother   . Hypertension Father   . Cancer Brother        testicular   . Healthy Daughter   . Healthy Son     Social History Social History   Tobacco Use  . Smoking status: Never Smoker  . Smokeless tobacco: Never Used  Substance Use Topics  . Alcohol use: Yes  Alcohol/week: 14.0 standard drinks    Types: 14 Glasses of wine per week  . Drug use: No     Allergies   Patient has no known allergies.   Review of Systems Review of Systems  All other systems reviewed and are negative.    Physical Exam Updated Vital Signs BP (!) 151/92   Pulse 84   Temp 99.6 F (37.6 C) (Axillary)   Resp 17   Ht 1.829 m (6')   Wt 90.7 kg   SpO2 95%   BMI 27.12 kg/m   Physical Exam Vitals signs and nursing note reviewed.  Constitutional:      General: He is not in acute distress.    Appearance: He is well-developed. He is ill-appearing.  HENT:     Head: Normocephalic.     Comments: Abrasion, healing laceration nose, no septal hematoma, no nasal deformity     Right Ear: External ear normal.     Left Ear: External ear normal.  Eyes:     General: No scleral icterus.       Right eye: No discharge.        Left eye: No discharge.     Conjunctiva/sclera: Conjunctivae normal.  Neck:     Musculoskeletal: Neck supple.     Trachea: No tracheal deviation.  Cardiovascular:     Rate and Rhythm: Normal rate and regular rhythm.  Pulmonary:     Effort: Pulmonary effort is normal. No respiratory distress.     Breath sounds: Normal breath sounds. No stridor. No wheezing or rales.     Comments: Bruising right posterior ribs, ttp,  Abdominal:     General: Bowel sounds are normal. There is no distension.     Palpations: Abdomen is soft.     Tenderness: There is no abdominal tenderness. There is no guarding or rebound.  Musculoskeletal:        General: No tenderness.  Skin:    General: Skin is warm and dry.     Findings: No rash.  Neurological:     Mental Status: He is alert.     Cranial Nerves: No cranial nerve deficit (no facial droop, extraocular movements intact, no slurred speech).     Sensory: No sensory deficit.     Motor: No abnormal muscle tone or seizure activity.     Coordination: Coordination normal.      ED Treatments / Results  Labs (all labs ordered are listed, but only abnormal results are displayed) Labs Reviewed  COMPREHENSIVE METABOLIC PANEL - Abnormal; Notable for the following components:      Result Value   Glucose, Bld 116 (*)    Total Protein 8.2 (*)    All other components within normal limits  CBC  ETHANOL  URINALYSIS, ROUTINE W REFLEX MICROSCOPIC    EKG EKG Interpretation  Date/Time:  Saturday May 30 2019 11:42:09 EDT Ventricular Rate:  89 PR Interval:    QRS Duration: 102 QT Interval:  382 QTC Calculation: 465 R Axis:   87 Text Interpretation: Sinus rhythm RSR' in V1 or V2, right VCD or RVH Baseline wander in lead(s) II No old tracing to compare Confirmed by Dorie Rank [phone]) on 05/30/2019 11:50:33 AM    Radiology Dg Chest Portable 1 View  Result Date: 05/30/2019 CLINICAL DATA:  Fall, fever, and right rib pain. EXAM: PORTABLE CHEST 1 VIEW COMPARISON:  Dec 09, 2012 FINDINGS: Bibasilar atelectasis is identified. The heart, hila, mediastinum, lungs, and pleura are otherwise normal. No pneumothorax. No rib fractures  are noted. IMPRESSION: Mild bibasilar atelectasis. Electronically Signed   By: Dorise Bullion III M.D   On: 05/30/2019 13:43    Procedures Procedures (including critical care time)  Medications Ordered in ED Medications  sodium chloride 0.9 % bolus 1,000 mL (1,000 mLs Intravenous New Bag/Given 05/30/19 1356)    Followed by  0.9 %  sodium chloride infusion (has no administration in time range)  sodium chloride 0.9 % with cefTRIAXone (ROCEPHIN) ADS Med (has no administration in time range)  sodium chloride 0.9 % with azithromycin (ZITHROMAX) ADS Med (has no administration in time range)  acetaminophen (TYLENOL) 325 MG tablet (has no administration in time range)  morphine 4 MG/ML injection 4 mg (has no administration in time range)  ketorolac (TORADOL) 30 MG/ML injection 30 mg (has no administration in time range)  bacitracin ointment 1 application (has no administration in time range)  morphine 4 MG/ML injection 4 mg (4 mg Intravenous Given 05/30/19 1349)     Initial Impression / Assessment and Plan / ED Course  I have reviewed the triage vital signs and the nursing notes.  Pertinent labs & imaging results that were available during my care of the patient were reviewed by me and considered in my medical decision making (see chart for details).  Clinical Course as of May 29 1548  Sat May 30, 2019  1545 Chest x-ray without definite fracture or pneumonia.Labs reassuring.     [JK]    Clinical Course User Index [JK] Dorie Rank, MD   Patient presented after a syncopal episode.  Symptoms may have been vasovagal in nature.  He has been having trouble with cough and fever.   Outpatient Covid test is pending.  Patient's ED work-up is reassuring.  He is afebrile.  He is not hypoxic or tachypneic.  Chest x-ray and laboratory tests are reassuring.  Patient does have significant chest wall tenderness.  No definite rib fractures but the patient very well may have an occult rib fracture.  Plan on discharge home with medications for pain.  Continue p.o. fluids.  Monitor for worsening respiratory difficulty.  Mitchell Morris was evaluated in Emergency Department on 05/30/2019 for the symptoms described in the history of present illness. He was evaluated in the context of the global COVID-19 pandemic, which necessitated consideration that the patient might be at risk for infection with the SARS-CoV-2 virus that causes COVID-19. Institutional protocols and algorithms that pertain to the evaluation of patients at risk for COVID-19 are in a state of rapid change based on information released by regulatory bodies including the CDC and federal and state organizations. These policies and algorithms were followed during the patient's care in the ED.   Final Clinical Impressions(s) / ED Diagnoses   Final diagnoses:  Injury of chest wall, initial encounter  Syncope, unspecified syncope type  Injury of nose, initial encounter    ED Discharge Orders         Ordered    HYDROcodone-acetaminophen (NORCO/VICODIN) 5-325 MG tablet  Every 4 hours PRN     05/30/19 1548    naproxen (NAPROSYN) 500 MG tablet  2 times daily with meals     05/30/19 1548           Dorie Rank, MD 05/30/19 1549

## 2019-05-30 NOTE — ED Notes (Signed)
While in triage, patient became unresponsive while sitting in chair. Head tilted back, eyes became constricted and fixated. Heart rate dropped to 40's. Episode lasted about 10-15 seconds.

## 2019-05-30 NOTE — Discharge Instructions (Signed)
Take the medications as needed for pain.  Drink plenty of fluids.  Follow up on your covid test.  Return as needed for worsening symptoms

## 2019-05-31 LAB — NOVEL CORONAVIRUS, NAA: SARS-CoV-2, NAA: DETECTED — AB

## 2019-06-02 NOTE — Progress Notes (Deleted)
Office Visit Note  Patient: Mitchell Morris             Date of Birth: 23-Dec-1965           MRN: [id number]             PCP: Mitchell Lass, MD Referring: Mitchell Lass, MD Visit Date: 06/16/2019 Occupation: @GUAROCC @  Subjective:  No chief complaint on file.   History of Present Illness: Mitchell Morris is a 53 y.o. male ***   Activities of Daily Living:  Patient reports morning stiffness for *** {minute/hour:19697}.   Patient {ACTIONS;DENIES/REPORTS:21021675::"Denies"} nocturnal pain.  Difficulty dressing/grooming: {ACTIONS;DENIES/REPORTS:21021675::"Denies"} Difficulty climbing stairs: {ACTIONS;DENIES/REPORTS:21021675::"Denies"} Difficulty getting out of chair: {ACTIONS;DENIES/REPORTS:21021675::"Denies"} Difficulty using hands for taps, buttons, cutlery, and/or writing: {ACTIONS;DENIES/REPORTS:21021675::"Denies"}  No Rheumatology ROS completed.   PMFS History:  Patient Active Problem List   Diagnosis Date Noted  . Goals of care, counseling/discussion 04/22/2019  . Elevated blood pressure reading 04/22/2019  . MGUS (monoclonal gammopathy of unknown significance) 03/26/2019  . Primary osteoarthritis of both hands 03/12/2017  . Primary osteoarthritis of both feet 03/12/2017  . DDD (degenerative disc disease), lumbar 03/12/2017  . Alcohol use/ keep Methotrexate dose low  03/12/2017  . Psoriatic arthritis ([place]) 09/27/2016  . Psoriasis 09/27/2016  . High risk medication use 09/27/2016    Past Medical History:  Diagnosis Date  . Psoriatic arthritis ([place])     Family History  Problem Relation Age of Onset  . Alzheimer's disease Mother   . Hypertension Father   . Cancer Brother        testicular   . Healthy Daughter   . Healthy Son    Past Surgical History:  Procedure Laterality Date  . KNEE ARTHROPLASTY     Social History   Social History Narrative  . Not on file    There is no immunization history on file for this patient.   Objective: Vital Signs:  There were no vitals taken for this visit.   Physical Exam   Musculoskeletal Exam: ***  CDAI Exam: CDAI Score: - Patient Global: -; Provider Global: - Swollen: -; Tender: - Joint Exam   No joint exam has been documented for this visit   There is currently no information documented on the homunculus. Go to the Rheumatology activity and complete the homunculus joint exam.  Investigation: No additional findings.  Imaging: Dg Chest Portable 1 View  Result Date: 05/30/2019 CLINICAL DATA:  Fall, fever, and right rib pain. EXAM: PORTABLE CHEST 1 VIEW COMPARISON:  Dec 09, 2012 FINDINGS: Bibasilar atelectasis is identified. The heart, hila, mediastinum, lungs, and pleura are otherwise normal. No pneumothorax. No rib fractures are noted. IMPRESSION: Mild bibasilar atelectasis. Electronically Signed   By: Dorise Bullion III M.D   On: 05/30/2019 13:43    Recent Labs: Lab Results  Component Value Date   WBC 6.2 05/30/2019   HGB 15.1 05/30/2019   PLT 225 05/30/2019   NA 138 05/30/2019   K 3.9 05/30/2019   CL 103 05/30/2019   CO2 24 05/30/2019   GLUCOSE 116 (H) 05/30/2019   BUN 18 05/30/2019   CREATININE 1.04 05/30/2019   BILITOT 0.7 05/30/2019   ALKPHOS 64 05/30/2019   AST 33 05/30/2019   ALT 44 05/30/2019   PROT 8.2 (H) 05/30/2019   ALBUMIN 4.3 05/30/2019   CALCIUM 9.4 05/30/2019   GFRAA >60 05/30/2019   QFTBGOLDPLUS NEGATIVE 03/17/2019    Speciality Comments: Prior therapy: Stelara (inadequate response) and Humira (good response but issues with coverage)  Procedures:  No procedures performed Allergies: Patient has no known allergies.   Assessment / Plan:     Visit Diagnoses: No diagnosis found.  Orders: No orders of the defined types were placed in this encounter.  No orders of the defined types were placed in this encounter.   Face-to-face time spent with patient was *** minutes. Greater than 50% of time was spent in counseling and coordination of care.   Follow-Up Instructions: No follow-ups on file.   Ofilia Neas, PA-C  Note - This record has been created using Dragon software.  Chart creation errors have been sought, but may not always  have been located. Such creation errors do not reflect on  the standard of medical care.

## 2019-06-16 ENCOUNTER — Ambulatory Visit: Payer: Commercial Managed Care - PPO | Admitting: Rheumatology

## 2019-06-22 ENCOUNTER — Other Ambulatory Visit: Payer: Self-pay | Admitting: Rheumatology

## 2019-06-22 DIAGNOSIS — L4050 Arthropathic psoriasis, unspecified: Secondary | ICD-10-CM

## 2019-06-22 NOTE — Telephone Encounter (Signed)
Last Visit: 03/25/2019 Next Visit: 07/15/2019 Labs: 05/30/2019 elevated glucose, total protein 8.2  Okay to refill per Dr. Estanislado Pandy.

## 2019-07-13 ENCOUNTER — Telehealth: Payer: Self-pay | Admitting: Rheumatology

## 2019-07-13 NOTE — Telephone Encounter (Signed)
Attempted to contact patient and left message on machine to advise patient we can see lab results from 05/30/2019 and he will be due for labs 08/29/2018. Advised patient to also have the November labs faxed over to the office.

## 2019-07-13 NOTE — Telephone Encounter (Signed)
Patient rs appt until February because he is doing well. Patient wants to know if he is due for labs? Please call to advise. Patient had more labs in November, but it was not at a [facility] facility.

## 2019-07-15 ENCOUNTER — Ambulatory Visit: Payer: Commercial Managed Care - PPO | Admitting: Rheumatology

## 2019-08-31 HISTORY — PX: COLONOSCOPY: SHX174

## 2019-09-02 ENCOUNTER — Telehealth: Payer: Self-pay | Admitting: Rheumatology

## 2019-09-02 NOTE — Telephone Encounter (Signed)
Patient calling to get [place] phone # for his Cosentyx. He has not been taking it for several months now. Please call to advise.

## 2019-09-03 NOTE — Telephone Encounter (Signed)
Patient has been off of treatment for a couple of months. I spoke with patient and advised him Dr. Estanislado Pandy would need to see him in the office prior to re-starting or sending in the prescription. He verbalized understanding and is scheduled for 09/17/2019.

## 2019-09-11 NOTE — Progress Notes (Deleted)
Office Visit Note  Patient: Mitchell Morris             Date of Birth: [birth year]           MRN: [id number]             PCP: Kathyrn Lass, MD Referring: Kathyrn Lass, MD Visit Date: 09/17/2019 Occupation: @GUAROCC @  Subjective:  No chief complaint on file.   History of Present Illness: Mitchell Morris is a 54 y.o. male ***   Activities of Daily Living:  Patient reports morning stiffness for *** {minute/hour:19697}.   Patient {ACTIONS;DENIES/REPORTS:21021675::"Denies"} nocturnal pain.  Difficulty dressing/grooming: {ACTIONS;DENIES/REPORTS:21021675::"Denies"} Difficulty climbing stairs: {ACTIONS;DENIES/REPORTS:21021675::"Denies"} Difficulty getting out of chair: {ACTIONS;DENIES/REPORTS:21021675::"Denies"} Difficulty using hands for taps, buttons, cutlery, and/or writing: {ACTIONS;DENIES/REPORTS:21021675::"Denies"}  No Rheumatology ROS completed.   PMFS History:  Patient Active Problem List   Diagnosis Date Noted  . Goals of care, counseling/discussion 04/22/2019  . Elevated blood pressure reading 04/22/2019  . MGUS (monoclonal gammopathy of unknown significance) 03/26/2019  . Primary osteoarthritis of both hands 03/12/2017  . Primary osteoarthritis of both feet 03/12/2017  . DDD (degenerative disc disease), lumbar 03/12/2017  . Alcohol use/ keep Methotrexate dose low  03/12/2017  . Psoriatic arthritis ([place]) 09/27/2016  . Psoriasis 09/27/2016  . High risk medication use 09/27/2016    Past Medical History:  Diagnosis Date  . Psoriatic arthritis ([place])     Family History  Problem Relation Age of Onset  . Alzheimer's disease Mother   . Hypertension Father   . Cancer Brother        testicular   . Healthy Daughter   . Healthy Son    Past Surgical History:  Procedure Laterality Date  . KNEE ARTHROPLASTY     Social History   Social History Narrative  . Not on file    There is no immunization history on file for this patient.   Objective: Vital Signs:  There were no vitals taken for this visit.   Physical Exam   Musculoskeletal Exam: ***  CDAI Exam: CDAI Score: -- Patient Global: --; Provider Global: -- Swollen: --; Tender: -- Joint Exam 09/17/2019   No joint exam has been documented for this visit   There is currently no information documented on the homunculus. Go to the Rheumatology activity and complete the homunculus joint exam.  Investigation: No additional findings.  Imaging: No results found.  Recent Labs: Lab Results  Component Value Date   WBC 6.2 05/30/2019   HGB 15.1 05/30/2019   PLT 225 05/30/2019   NA 138 05/30/2019   K 3.9 05/30/2019   CL 103 05/30/2019   CO2 24 05/30/2019   GLUCOSE 116 (H) 05/30/2019   BUN 18 05/30/2019   CREATININE 1.04 05/30/2019   BILITOT 0.7 05/30/2019   ALKPHOS 64 05/30/2019   AST 33 05/30/2019   ALT 44 05/30/2019   PROT 8.2 (H) 05/30/2019   ALBUMIN 4.3 05/30/2019   CALCIUM 9.4 05/30/2019   GFRAA >60 05/30/2019   QFTBGOLDPLUS NEGATIVE 03/17/2019    Speciality Comments: Prior therapy: Stelara (inadequate response) and Humira (good response but issues with coverage)  Procedures:  No procedures performed Allergies: Patient has no known allergies.   Assessment / Plan:     Visit Diagnoses: No diagnosis found.  Orders: No orders of the defined types were placed in this encounter.  No orders of the defined types were placed in this encounter.   Face-to-face time spent with patient was *** minutes. Greater than 50% of time was  spent in counseling and coordination of care.  Follow-Up Instructions: No follow-ups on file.   Ofilia Neas, PA-C  Note - This record has been created using Dragon software.  Chart creation errors have been sought, but may not always  have been located. Such creation errors do not reflect on  the standard of medical care.

## 2019-09-16 NOTE — Progress Notes (Signed)
Office Visit Note  Patient: Mitchell Morris             Date of Birth: 12-29-1965           MRN: [id number]             PCP: Kathyrn Lass, MD Referring: Kathyrn Lass, MD Visit Date: 09/21/2019 Occupation: @GUAROCC @  Subjective:  Pain in both hands   History of Present Illness: Mitchell Morris is a 54 y.o. male with history of psoriatic arthritis and osteoarthritis.  He has been holding Cosentyx and Otrexup since November 2020. He tested positive for covid-19 on 05/29/19.  He states that he had a low-grade fever and lost his sense of taste and smell for about 2 to 3 days but his symptoms have completely resolved.  He did not restart his medications due to issues with a co-pay card according to the patient.  He states that he is experiencing increased pain and stiffness in both hands.  He denies any joint swelling.  He states he has been experiencing nocturnal pain in both hands and increased morning stiffness lasting for 2 hours.  He states the last few days he has had increased pain in both knee joints but denies any joint swelling.  He denies any Achilles tendinitis or plantar fasciitis.  He denies any SI joint pain.  He denies any active psoriasis.  He has been taking naproxen very sparingly for pain relief.   Activities of Daily Living:  Patient reports morning stiffness for 2  hours.   Patient Reports nocturnal pain.  Difficulty dressing/grooming: Denies Difficulty climbing stairs: Denies Difficulty getting out of chair: Denies Difficulty using hands for taps, buttons, cutlery, and/or writing: Reports  Review of Systems  Constitutional: Negative for fatigue and night sweats.  HENT: Negative for mouth sores, mouth dryness and nose dryness.   Eyes: Negative for redness and dryness.  Respiratory: Negative for cough, hemoptysis, shortness of breath and difficulty breathing.   Cardiovascular: Negative for chest pain, palpitations, hypertension, irregular heartbeat and swelling  in legs/feet.  Gastrointestinal: Negative for blood in stool, constipation and diarrhea.  Endocrine: Negative for increased urination.  Genitourinary: Negative for painful urination.  Musculoskeletal: Positive for arthralgias, joint pain and morning stiffness. Negative for joint swelling, myalgias, muscle weakness, muscle tenderness and myalgias.  Skin: Negative for color change, rash, hair loss, nodules/bumps, skin tightness, ulcers and sensitivity to sunlight.  Allergic/Immunologic: Negative for susceptible to infections.  Neurological: Negative for dizziness, fainting, memory loss, night sweats and weakness.  Hematological: Negative for swollen glands.  Psychiatric/Behavioral: Negative for depressed mood and sleep disturbance. The patient is not nervous/anxious.     PMFS History:  Patient Active Problem List   Diagnosis Date Noted  . Goals of care, counseling/discussion 04/22/2019  . Elevated blood pressure reading 04/22/2019  . MGUS (monoclonal gammopathy of unknown significance) 03/26/2019  . Primary osteoarthritis of both hands 03/12/2017  . Primary osteoarthritis of both feet 03/12/2017  . DDD (degenerative disc disease), lumbar 03/12/2017  . Alcohol use/ keep Methotrexate dose low  03/12/2017  . Psoriatic arthritis ([place]) 09/27/2016  . Psoriasis 09/27/2016  . High risk medication use 09/27/2016    Past Medical History:  Diagnosis Date  . Psoriatic arthritis ([place])     Family History  Problem Relation Age of Onset  . Alzheimer's disease Mother   . Hypertension Father   . Cancer Brother        testicular   . Healthy Daughter   . Healthy  Son    Past Surgical History:  Procedure Laterality Date  . COLONOSCOPY  08/2019  . KNEE ARTHROPLASTY     Social History   Social History Narrative  . Not on file    There is no immunization history on file for this patient.   Objective: Vital Signs: BP (!) 154/92 (BP Location: Left Arm, Patient Position: Sitting, Cuff Size:  Normal)   Pulse 75   Resp 14   Ht 6' (1.829 m)   Wt 205 lb (93 kg)   BMI 27.80 kg/m    Physical Exam Vitals and nursing note reviewed.  Constitutional:      Appearance: He is well-developed.  HENT:     Head: Normocephalic and atraumatic.  Eyes:     Conjunctiva/sclera: Conjunctivae normal.     Pupils: Pupils are equal, round, and reactive to light.  Cardiovascular:     Rate and Rhythm: Normal rate and regular rhythm.     Heart sounds: Normal heart sounds.  Pulmonary:     Effort: Pulmonary effort is normal.     Breath sounds: Normal breath sounds.  Abdominal:     General: Bowel sounds are normal.     Palpations: Abdomen is soft.  Musculoskeletal:     Cervical back: Normal range of motion and neck supple.  Skin:    General: Skin is warm and dry.     Capillary Refill: Capillary refill takes less than 2 seconds.  Neurological:     Mental Status: He is alert and oriented to person, place, and time.  Psychiatric:        Behavior: Behavior normal.     Musculoskeletal Exam: C-spine limited range of motion with lateral rotation.  Thoracic lumbar spine good range of motion.  No midline spinal tenderness.  No SI joint tenderness.  Shoulder joints, elbow joints, wrist joints, MCPs, PIPs and DIPs good range of motion with no synovitis.  He has tenderness of the right second and third PIP joints.  Hip joints have good range of motion with no discomfort.  Knee joints have good range of motion with no warmth or effusion.  Ankle joints have good range of motion with no tenderness or inflammation.  No Achilles tendinitis or plantar fasciitis.  No tenderness of MTP joints.  CDAI Exam: CDAI Score: 2.6  Patient Global: 3 mm; Provider Global: 3 mm Swollen: 2 ; Tender: 2  Joint Exam 09/21/2019      Right  Left  PIP 3  Swollen Tender     DIP 3  Swollen Tender        Investigation: No additional findings.  Imaging: No results found.  Recent Labs: Lab Results  Component Value Date    WBC 6.2 05/30/2019   HGB 15.1 05/30/2019   PLT 225 05/30/2019   NA 138 05/30/2019   K 3.9 05/30/2019   CL 103 05/30/2019   CO2 24 05/30/2019   GLUCOSE 116 (H) 05/30/2019   BUN 18 05/30/2019   CREATININE 1.04 05/30/2019   BILITOT 0.7 05/30/2019   ALKPHOS 64 05/30/2019   AST 33 05/30/2019   ALT 44 05/30/2019   PROT 8.2 (H) 05/30/2019   ALBUMIN 4.3 05/30/2019   CALCIUM 9.4 05/30/2019   GFRAA >60 05/30/2019   QFTBGOLDPLUS NEGATIVE 03/17/2019    Speciality Comments: Prior therapy: Stelara (inadequate response) and Humira (good response but issues with coverage)  Procedures:  No procedures performed Allergies: Patient has no known allergies.   Assessment / Plan:     Visit  Diagnoses: Psoriatic arthritis ([place]) - hx of dactylitis: He presents today with dactylitis in the right third digit.  He has been experiencing increased pain and stiffness in both hands for the past several months.  He has been experiencing nocturnal pain in both hands as well as paresthesias in the right hand especially at night.  He has started having increased pain in both knee joints over the past 3 to 4 days.  No warmth or effusion of his knee joints were noted on exam today.  He has no Achilles tendinitis or plantar fasciitis.  No SI joint tenderness on exam.  He has no active psoriasis at this time.  He has been holding Cosentyx and Otrexup since November 2020.  He was diagnosed with COVID-19 on 05/29/2019.  He had mild symptoms that resolved within several days but has not restarted due to issues with the copay cards according to the patient.  He is overdue to update lab work.  CBC and CMP were drawn today.  Once labs have resulted a refill of Otrexup and cosentyx will be sent to the pharmacy.  He will schedule a nurse visit to restart on Cosentyx since it has been over 3 months since his last injection.  He was advised to notify us if he develops increased joint pain or joint swelling.  He will follow-up in the office  in 3 months.  Psoriasis: He has no active psoriasis at this time.  He continues to follow-up with his dermatologist on a routine basis.  High risk medication use - Cosentyx 150 mg sq every 14 days, methotrexate 0.6 ml sq injections every 7 days, and folic acid 1 mg 1 tablet daily.  CBC and CMP were drawn on 05/30/2019.  He is overdue to update CBC and CMP.  TB gold was negative on 03/17/2019.- Plan: CBC with Differential/Platelet, COMPLETE METABOLIC PANEL WITH GFR  Paresthesia of both hands:   Primary osteoarthritis of both hands: He has PIP and DIP synovial thickening consistent with osteoarthritis of both hands.  Dactylitis of right 3rd digit.  Patient experiencing increased pain and stiffness in both hands.  He is experiencing nocturnal pain in both hands as well.  Joint protection and muscle strengthening were discussed.  Primary osteoarthritis of both feet: He has no tenderness or inflammation in his feet at this time.   Neck stiffness: He has been experiencing increased neck pain and stiffness over the past several months.  He has limited range of motion with lateral rotation especially to the right.  We discussed the importance of performing neck exercises.  He was given a handout of these exercises to perform.   Alcohol use/ keep Methotrexate dose low  - He will be restarting on MTX 0.6 ml sq once weekly.    DDD (degenerative disc disease), lumbar: He has good ROM with no discomfort at this time.  No symptoms of radiculopathy.   Orders: Orders Placed This Encounter  Procedures  . CBC with Differential/Platelet  . COMPLETE METABOLIC PANEL WITH GFR   No orders of the defined types were placed in this encounter.   Face-to-face time spent with patient was 30 minutes. Greater than 50% of time was spent in counseling and coordination of care.  Follow-Up Instructions: Return in about 3 months (around 12/19/2019) for Psoriatic arthritis, Osteoarthritis.   Ofilia Neas, PA-C   I  examined and evaluated the patient with Hazel Sams PA.  Patient has been off Cosentyx.  He is having a flare with increased  pain and swelling in his joints on my exam.  We will obtain labs today and then restart his methotrexate and Cosentyx.  The plan of care was discussed as noted above.  Bo Merino, MD  Note - This record has been created using Editor, commissioning.  Chart creation errors have been sought, but may not always  have been located. Such creation errors do not reflect on  the standard of medical care.

## 2019-09-17 ENCOUNTER — Ambulatory Visit: Payer: Commercial Managed Care - PPO | Admitting: Rheumatology

## 2019-09-21 ENCOUNTER — Ambulatory Visit: Payer: Commercial Managed Care - PPO | Admitting: Rheumatology

## 2019-09-21 ENCOUNTER — Telehealth: Payer: Self-pay

## 2019-09-21 ENCOUNTER — Encounter: Payer: Self-pay | Admitting: Rheumatology

## 2019-09-21 ENCOUNTER — Other Ambulatory Visit: Payer: Self-pay

## 2019-09-21 VITALS — BP 154/92 | HR 75 | Resp 14 | Ht 72.0 in | Wt 205.0 lb

## 2019-09-21 DIAGNOSIS — L409 Psoriasis, unspecified: Secondary | ICD-10-CM

## 2019-09-21 DIAGNOSIS — L4050 Arthropathic psoriasis, unspecified: Secondary | ICD-10-CM

## 2019-09-21 DIAGNOSIS — Z79899 Other long term (current) drug therapy: Secondary | ICD-10-CM

## 2019-09-21 DIAGNOSIS — M19042 Primary osteoarthritis, left hand: Secondary | ICD-10-CM

## 2019-09-21 DIAGNOSIS — M5136 Other intervertebral disc degeneration, lumbar region: Secondary | ICD-10-CM

## 2019-09-21 DIAGNOSIS — Z7289 Other problems related to lifestyle: Secondary | ICD-10-CM

## 2019-09-21 DIAGNOSIS — M19071 Primary osteoarthritis, right ankle and foot: Secondary | ICD-10-CM

## 2019-09-21 DIAGNOSIS — Z789 Other specified health status: Secondary | ICD-10-CM

## 2019-09-21 DIAGNOSIS — R202 Paresthesia of skin: Secondary | ICD-10-CM

## 2019-09-21 DIAGNOSIS — M19041 Primary osteoarthritis, right hand: Secondary | ICD-10-CM

## 2019-09-21 DIAGNOSIS — M436 Torticollis: Secondary | ICD-10-CM

## 2019-09-21 DIAGNOSIS — M19072 Primary osteoarthritis, left ankle and foot: Secondary | ICD-10-CM

## 2019-09-21 LAB — COMPLETE METABOLIC PANEL WITH GFR
AG Ratio: 1.3 (calc) (ref 1.0–2.5)
ALT: 21 U/L (ref 9–46)
AST: 17 U/L (ref 10–35)
Albumin: 4.4 g/dL (ref 3.6–5.1)
Alkaline phosphatase (APISO): 85 U/L (ref 35–144)
BUN: 19 mg/dL (ref 7–25)
CO2: 24 mmol/L (ref 20–32)
Calcium: 9.9 mg/dL (ref 8.6–10.3)
Chloride: 104 mmol/L (ref 98–110)
Creat: 0.88 mg/dL (ref 0.70–1.33)
GFR, Est African American: 114 mL/min/{1.73_m2} (ref >=60)
GFR, Est Non African American: 98 mL/min/{1.73_m2} (ref >=60)
Globulin: 3.3 g/dL (calc) (ref 1.9–3.7)
Glucose, Bld: 107 mg/dL — ABNORMAL HIGH (ref 65–99)
Potassium: 4.1 mmol/L (ref 3.5–5.3)
Sodium: 138 mmol/L (ref 135–146)
Total Bilirubin: 0.5 mg/dL (ref 0.2–1.2)
Total Protein: 7.7 g/dL (ref 6.1–8.1)

## 2019-09-21 LAB — CBC WITH DIFFERENTIAL/PLATELET
Absolute Monocytes: 451 cells/uL (ref 200–950)
Basophils Absolute: 22 cells/uL (ref 0–200)
Basophils Relative: 0.4 %
Eosinophils Absolute: 22 cells/uL (ref 15–500)
Eosinophils Relative: 0.4 %
HCT: 46.9 % (ref 38.5–50.0)
Hemoglobin: 16.1 g/dL (ref 13.2–17.1)
Lymphs Abs: 1381 cells/uL (ref 850–3900)
MCH: 29.9 pg (ref 27.0–33.0)
MCHC: 34.3 g/dL (ref 32.0–36.0)
MCV: 87 fL (ref 80.0–100.0)
MPV: 10.1 fL (ref 7.5–12.5)
Monocytes Relative: 8.2 %
Neutro Abs: 3625 cells/uL (ref 1500–7800)
Neutrophils Relative %: 65.9 %
Platelets: 271 10*3/uL (ref 140–400)
RBC: 5.39 10*6/uL (ref 4.20–5.80)
RDW: 12.4 % (ref 11.0–15.0)
Total Lymphocyte: 25.1 %
WBC: 5.5 10*3/uL (ref 3.8–10.8)

## 2019-09-21 NOTE — Telephone Encounter (Signed)
Per Hazel Sams, PA-C, patient will be re-starting otrexup and cosentyx after labs result (patient will need a refill of both). Please schedule a nurse visit to re-start cosentyx. Thanks!

## 2019-09-21 NOTE — Patient Instructions (Signed)
Standing Labs We placed an order today for your standing lab work.    Please come back and get your standing labs in May and every 3 months   We have open lab daily Monday through Thursday from 8:30-12:30 PM and 1:30-4:30 PM and Friday from 8:30-12:30 PM and 1:30-4:00 PM at the office of Dr. Bo Merino.   You may experience shorter wait times on Monday and Friday afternoons. The office is located at [address] [place], [place], [place] [address] No appointment is necessary.   Labs are drawn by [facility].  You may receive a bill from [facility] for your lab work.  If you wish to have your labs drawn at another location, please call the office 24 hours in advance to send orders.  If you have any questions regarding directions or hours of operation,  please call [phone].   Just as a reminder please drink plenty of water prior to coming for your lab work. Thanks!    Neck Exercises Ask your health care provider which exercises are safe for you. Do exercises exactly as told by your health care provider and adjust them as directed. It is normal to feel mild stretching, pulling, tightness, or discomfort as you do these exercises. Stop right away if you feel sudden pain or your pain gets worse. Do not begin these exercises until told by your health care provider. Neck exercises can be important for many reasons. They can improve strength and maintain flexibility in your neck, which will help your upper back and prevent neck pain. Stretching exercises Rotation neck stretching  1. Sit in a chair or stand up. 2. Place your feet flat on the floor, shoulder width apart. 3. Slowly turn your head (rotate) to the right until a slight stretch is felt. Turn it all the way to the right so you can look over your right shoulder. Do not tilt or tip your head. 4. Hold this position for 10-30 seconds. 5. Slowly turn your head (rotate) to the left until a slight stretch is felt. Turn it all the way  to the left so you can look over your left shoulder. Do not tilt or tip your head. 6. Hold this position for 10-30 seconds. Repeat __________ times. Complete this exercise __________ times a day. Neck retraction 1. Sit in a sturdy chair or stand up. 2. Look straight ahead. Do not bend your neck. 3. Use your fingers to push your chin backward (retraction). Do not bend your neck for this movement. Continue to face straight ahead. If you are doing the exercise properly, you will feel a slight sensation in your throat and a stretch at the back of your neck. 4. Hold the stretch for 1-2 seconds. Repeat __________ times. Complete this exercise __________ times a day. Strengthening exercises Neck press 1. Lie on your back on a firm bed or on the floor with a pillow under your head. 2. Use your neck muscles to push your head down on the pillow and straighten your spine. 3. Hold the position as well as you can. Keep your head facing up (in a neutral position) and your chin tucked. 4. Slowly count to 5 while holding this position. Repeat __________ times. Complete this exercise __________ times a day. Isometrics These are exercises in which you strengthen the muscles in your neck while keeping your neck still (isometrics). 1. Sit in a supportive chair and place your hand on your forehead. 2. Keep your head and face facing straight ahead.  Do not flex or extend your neck while doing isometrics. 3. Push forward with your head and neck while pushing back with your hand. Hold for 10 seconds. 4. Do the sequence again, this time putting your hand against the back of your head. Use your head and neck to push backward against the hand pressure. 5. Finally, do the same exercise on either side of your head, pushing sideways against the pressure of your hand. Repeat __________ times. Complete this exercise __________ times a day. Prone head lifts 1. Lie face-down (prone position), resting on your elbows so that  your chest and upper back are raised. 2. Start with your head facing downward, near your chest. Position your chin either on or near your chest. 3. Slowly lift your head upward. Lift until you are looking straight ahead. Then continue lifting your head as far back as you can comfortably stretch. 4. Hold your head up for 5 seconds. Then slowly lower it to your starting position. Repeat __________ times. Complete this exercise __________ times a day. Supine head lifts 1. Lie on your back (supine position), bending your knees to point to the ceiling and keeping your feet flat on the floor. 2. Lift your head slowly off the floor, raising your chin toward your chest. 3. Hold for 5 seconds. Repeat __________ times. Complete this exercise __________ times a day. Scapular retraction 1. Stand with your arms at your sides. Look straight ahead. 2. Slowly pull both shoulders (scapulae) backward and downward (retraction) until you feel a stretch between your shoulder blades in your upper back. 3. Hold for 10-30 seconds. 4. Relax and repeat. Repeat __________ times. Complete this exercise __________ times a day. Contact a health care provider if:  Your neck pain or discomfort gets much worse when you do an exercise.  Your neck pain or discomfort does not improve within 2 hours after you exercise. If you have any of these problems, stop exercising right away. Do not do the exercises again unless your health care provider says that you can. Get help right away if:  You develop sudden, severe neck pain. If this happens, stop exercising right away. Do not do the exercises again unless your health care provider says that you can. This information is not intended to replace advice given to you by your health care provider. Make sure you discuss any questions you have with your health care provider. Document Revised: 05/14/2018 Document Reviewed: 05/14/2018 Elsevier Patient Education  [place].

## 2019-09-22 ENCOUNTER — Telehealth: Payer: Self-pay | Admitting: Pharmacist

## 2019-09-22 NOTE — Telephone Encounter (Signed)
Submitted a Prior Authorization request to [employer] for COSENTYX via Cover My Meds. Will update once we receive a response.  ([id number]) [id number]

## 2019-09-22 NOTE — Telephone Encounter (Signed)
-----   Message from Shona Needles, RT sent at 09/22/2019 11:55 AM EST ----- Patient would like to schedule Cosenytx injection. Labs results. Please call patient.   ----- Message ----- From: Ofilia Neas, PA-C Sent: 09/22/2019   8:06 AM EST To: [facility] Clinical  Glucose is 107. Rest of CMP WNL.  CBC WNL

## 2019-09-22 NOTE — Progress Notes (Signed)
Glucose is 107. Rest of CMP WNL.  CBC WNL.

## 2019-09-22 NOTE — Telephone Encounter (Signed)
Called to schedule an appointment to restart Cosentyx.  No answer left voicemail asking to schedule an appointment and that he will have to re-enroll in patient assistance.  He can bring a completed application along with income documents to his appointment.  Rachael can you see if we need a PA for the new year?  Thanks!  Mariella Saa, PharmD, [place], [place] Clinical Specialty Pharmacist [phone]  09/22/2019 12:16 PM

## 2019-09-23 ENCOUNTER — Telehealth: Payer: Self-pay | Admitting: Rheumatology

## 2019-09-23 MED ORDER — OTREXUP 15 MG/0.4ML ~~LOC~~ SOAJ: SUBCUTANEOUS | 0 refills | Status: DC

## 2019-09-23 NOTE — Addendum Note (Signed)
Addended by: Carole Binning on: 09/23/2019 04:23 PM   Modules accepted: Orders

## 2019-09-23 NOTE — Telephone Encounter (Signed)
[place] from [employer] called stating patient's prior authorization for Cosentyx has been approved.  Approval dates:  10/21/2019 to 10/23/2020.   Please send a new prescription to [facility].  Phone [phone]   Fax [phone]

## 2019-09-23 NOTE — Telephone Encounter (Signed)
Received notification from [facility] regarding a prior authorization for [place]. Authorization has been APPROVED from 09/22/19 to 10/23/20.   Unable to run test claim as patient must fill through [facility] in [place], [place].   Phone# [phone] Fax# [phone]  Message was left for patient to call office to schedule New start Visit and to bring completed patient assistance application.  12:42 PM Beatriz Chancellor, CPhT

## 2019-09-23 NOTE — Telephone Encounter (Signed)
Prescription sent to the pharmacy for [place]. Will send prescription for Cosentyx once patient has some to the office for restart appointment. Message has been left for patient to call the office so we may schedule patient.

## 2019-09-24 ENCOUNTER — Telehealth: Payer: Self-pay | Admitting: Pharmacy Technician

## 2019-09-24 ENCOUNTER — Telehealth: Payer: Self-pay | Admitting: Rheumatology

## 2019-09-24 NOTE — Telephone Encounter (Signed)
Submitted a Prior Authorization request to [employer] for OTREXUP via Cover My Meds. Will update once we receive a response.  (Key: [id number])

## 2019-09-24 NOTE — Telephone Encounter (Signed)
Prior Auth they received was for 15mg  of Otrexcep. Pharmacy is processing 20mg . Please provide PA for 20 mg. Fax# [phone]

## 2019-09-25 ENCOUNTER — Telehealth: Payer: Self-pay | Admitting: Rheumatology

## 2019-09-25 MED ORDER — RASUVO 15 MG/0.3ML ~~LOC~~ SOAJ: 15.0000 mg | SUBCUTANEOUS | 0 refills | Status: DC

## 2019-09-25 NOTE — Telephone Encounter (Signed)
[employer] calling back in reference to PA. Please call Gilmore Laroche at [facility] [phone]

## 2019-09-25 NOTE — Telephone Encounter (Signed)
Attempted to contact the patient and left message for patient to call the office.  

## 2019-09-25 NOTE — Telephone Encounter (Signed)
Rasuvo prescription sent to patient's pharmacy.

## 2019-09-25 NOTE — Telephone Encounter (Signed)
Returned Tricia's call to confirm PA dosage. Pa is pending.

## 2019-09-25 NOTE — Telephone Encounter (Signed)
Terry from [facility] called stating they will be unable to process patient's prescription of Otrexup due to being unavailable from the manufacturer.  If you have any questions, please call back at [phone]

## 2019-09-25 NOTE — Telephone Encounter (Signed)
Returned call to [employer] to advise patient's prescription was for Otrexup 15mg  pens. Spoke to [place] and she will continue to process the prior authorization for Otrexup 15mg . Called [facility], had to leave a message to confirm dosage they are processing and advise that PA is still pending.  10:53 AM Beatriz Chancellor, CPhT

## 2019-09-25 NOTE — Telephone Encounter (Signed)
Called Tricia at [facility] to see if patient would be able to fill Rasuvo while Otrexup is on back order. She will reach out to pharmacist to see if that can be done. Or if new Prior Authorization will be needed.   Phone# # [phone]   1:53 PM Beatriz Chancellor, CPhT

## 2019-09-25 NOTE — Telephone Encounter (Signed)
Received call back from [facility] advising they will place an override on patient's account to allow patient to fill Rasuvo due to Mitchell Morris. Onalee Hua will be valid through August. Please send in prescription for Rasuvo for patient to [facility].  3:51 PM Beatriz Chancellor, CPhT

## 2019-09-28 NOTE — Progress Notes (Signed)
Pharmacy Note  Subjective:   Patient is resuming Cosentyx after a gap in therapy greater than 3 months due to insurance issues and COVID-19 infection.  Patient was previously counseled extensively on and consented to initiation of Cosentyx at that time.    Objective: CMP     Component Value Date/Time   NA 138 09/21/2019 0958   K 4.1 09/21/2019 0958   CL 104 09/21/2019 0958   CO2 24 09/21/2019 0958   GLUCOSE 107 (H) 09/21/2019 0958   BUN 19 09/21/2019 0958   CREATININE 0.88 09/21/2019 0958   CALCIUM 9.9 09/21/2019 0958   PROT 7.7 09/21/2019 0958   ALBUMIN 4.3 05/30/2019 1358   AST 17 09/21/2019 0958   AST 20 03/26/2019 1458   ALT 21 09/21/2019 0958   ALT 31 03/26/2019 1458   ALKPHOS 64 05/30/2019 1358   BILITOT 0.5 09/21/2019 0958   BILITOT 0.6 03/26/2019 1458   GFRNONAA 98 09/21/2019 0958   GFRAA 114 09/21/2019 0958    CBC    Component Value Date/Time   WBC 5.5 09/21/2019 0958   RBC 5.39 09/21/2019 0958   HGB 16.1 09/21/2019 0958   HGB 14.5 04/15/2019 0859   HCT 46.9 09/21/2019 0958   PLT 271 09/21/2019 0958   PLT 241 04/15/2019 0859   MCV 87.0 09/21/2019 0958   MCH 29.9 09/21/2019 0958   MCHC 34.3 09/21/2019 0958   RDW 12.4 09/21/2019 0958   LYMPHSABS 1,381 09/21/2019 0958   MONOABS 0.4 04/15/2019 0859   EOSABS 22 09/21/2019 0958   BASOSABS 22 09/21/2019 0958    Baseline Immunosuppressant Therapy Labs TB GOLD Quantiferon TB Gold Latest Ref Rng & Units 03/17/2019  Quantiferon TB Gold Plus NEGATIVE NEGATIVE   Hepatitis Panel   HIV Lab Results  Component Value Date   HIV NON-REACTIVE 03/17/2019   Immunoglobulins Immunoglobulin Electrophoresis Latest Ref Rng & Units 03/26/2019  IgA  47 - 310 mg/dL -  IgG 603 - 1,613 mg/dL 1,221  IgM 20 - 172 mg/dL 197(H)   SPEP Serum Protein Electrophoresis Latest Ref Rng & Units 09/21/2019  Total Protein 6.1 - 8.1 g/dL 7.7  Albumin 3.8 - 4.8 g/dL -  Alpha-1 0.2 - 0.3 g/dL -  Alpha-2 0.5 - 0.9 g/dL -  Beta Globulin  0.4 - 0.6 g/dL -  Beta 2 0.2 - 0.5 g/dL -  Gamma Globulin 0.8 - 1.7 g/dL -   G6PD No results found for: G6PDH TPMT No results found for: TPMT   Chest x-ray: Mild bibasilar atelectasis on 05/29/2020  Patient running a fever or have signs/symptoms of infection? No  Assessment/Plan:  Demonstrated proper injection technique with Cosentyx demo pen.  Patient able to demonstrate proper injection technique using the teach back method.  Patient self injected in the left upper thigh with:  Sample Medication: Cosentyx Sensoready pen NDC: [id number]  Lot: [id number] Expiration: May 2022  Patient tolerated well.  Observed for 30 mins in office for adverse reaction and none noted.   Patient is to return in 3 months for follow up appointment and labs.  Patient has an appointment scheduled on 12/21/2019.  Standing orders placed. [facility] required per insurance.  He received his first shipment from the pharmacy yesterday although there is no documentation that our office sent in a prescription.  I assume they had previous prescription transferred to the pharmacy.  They were able to reload patient's co-pay card and has no issues with affording medication at this time.  Last  year in June or July patient exhausted co-pay card funds in our office assisted patient in enrolling into patient assistance.  Encouraged patient to pharmacy notify him close to exact same findings in order to proactively signing up for patient assistance to prevent delays in therapy.  Patient verbalized understanding.  During medication reconciliation patient states he was not taking folic acid.  Stressed importance of taking folic acid daily with weekly methotrexate in order to reduce adverse events.  Patient verbalized understanding.  Patient's blood pressure was elevated at today's visit.  Patient states readings from today are consistent to when he put checks his blood pressure at home.  He was previously  on blood pressure medication but it was discontinued when he had an episode of hypotension that caused a fall where he broke his nose and fractured 2 ribs.  His primary care provider is closely monitoring his blood pressure medication and plans to resume antihypertensive medication in the near future.  Advised patient to continue to follow-up with his PCP in regards to his blood pressure.  Patient verbalized understanding.  All questions encouraged and answered.  Instructed patient to call with any further questions or concerns.  Mariella Saa, PharmD, [facility] Rheumatology Clinical Pharmacist  09/28/2019 11:33 AM

## 2019-09-28 NOTE — Telephone Encounter (Signed)
Spoke with patient and advise patient he may use vial and syringe if he would like to prevent further delays in resuming medication. Patient states he was just on the phone with the specialty pharmacy. Patient states they will be shipping Rasuvo. Patient states he is due to receive a shipment of Cosentyx tomorrow. Patient advised he has to be restarted in the office as there is a potential for a reaction. Patient has been scheduled for 09/30/19 at 9:00 am.

## 2019-09-28 NOTE — Telephone Encounter (Signed)
Received call from [place] at [facility] in regards to new Rasuvo prescription.  Advised that pharmacy states Otrexup 15 mg is not in stock.  We were asked if he wanted to increase to 20 mg of OTREXUP which was in stock.  We want patient to remain on 15 mg.  Reached out to insurance to get approval to fill Rasuvo 15 mg.  Prior authorization not required.  Coralyn Mark advised that Rasuvo 15 mg is in short supply and they do not have enough to fill a full 30-day supply.  She will contact patient and insurance to see what they can do.  Advised that patient can call the office for methotrexate vial and syringe.   Mariella Saa, PharmD, [place], [place] Clinical Specialty Pharmacist [phone]  09/28/2019 9:20 AM

## 2019-09-28 NOTE — Telephone Encounter (Signed)
Prescription will be sent in at his new start appointment which he has not scheduled yet.   Mariella Saa, PharmD, [place], [place] Clinical Specialty Pharmacist [phone]  09/28/2019 9:15 AM

## 2019-09-30 ENCOUNTER — Other Ambulatory Visit: Payer: Self-pay

## 2019-09-30 ENCOUNTER — Ambulatory Visit (INDEPENDENT_AMBULATORY_CARE_PROVIDER_SITE_OTHER): Payer: Commercial Managed Care - PPO | Admitting: Pharmacist

## 2019-09-30 VITALS — BP 150/86 | HR 73

## 2019-09-30 DIAGNOSIS — L4050 Arthropathic psoriasis, unspecified: Secondary | ICD-10-CM

## 2019-09-30 MED ORDER — COSENTYX SENSOREADY (300 MG) 150 MG/ML ~~LOC~~ SOAJ: 150.0000 mg | SUBCUTANEOUS | 0 refills | Status: DC

## 2019-09-30 NOTE — Patient Instructions (Signed)
   You can go to [email or web address] or call  [phone]) to sign-up for a co-pay card if needed.

## 2019-10-08 ENCOUNTER — Other Ambulatory Visit: Payer: Self-pay | Admitting: Rheumatology

## 2019-10-08 DIAGNOSIS — L4050 Arthropathic psoriasis, unspecified: Secondary | ICD-10-CM

## 2019-10-08 NOTE — Telephone Encounter (Signed)
Last Visit: 09/21/19 Next Visit: 12/21/19 Labs: 09/21/19 Glucose is 107. Rest of CMP WNL. CBC WNL TB Gold: 03/17/19 Neg    Okay to refill per Dr. Estanislado Pandy   Per office note on 09/21/19 : Cosentyx 150 mg sq every 14 days,

## 2019-12-09 NOTE — Progress Notes (Deleted)
Office Visit Note  Patient: Mitchell Morris             Date of Birth: Jun 27, 1966           MRN: [id number]             PCP: Kathyrn Lass, MD Referring: Kathyrn Lass, MD Visit Date: 12/21/2019 Occupation: @GUAROCC @  Subjective:  No chief complaint on file.   History of Present Illness: Mitchell Morris is a 54 y.o. male ***   Activities of Daily Living:  Patient reports morning stiffness for *** {minute/hour:19697}.   Patient {ACTIONS;DENIES/REPORTS:21021675::"Denies"} nocturnal pain.  Difficulty dressing/grooming: {ACTIONS;DENIES/REPORTS:21021675::"Denies"} Difficulty climbing stairs: {ACTIONS;DENIES/REPORTS:21021675::"Denies"} Difficulty getting out of chair: {ACTIONS;DENIES/REPORTS:21021675::"Denies"} Difficulty using hands for taps, buttons, cutlery, and/or writing: {ACTIONS;DENIES/REPORTS:21021675::"Denies"}  No Rheumatology ROS completed.   PMFS History:  Patient Active Problem List   Diagnosis Date Noted  . Goals of care, counseling/discussion 04/22/2019  . Elevated blood pressure reading 04/22/2019  . MGUS (monoclonal gammopathy of unknown significance) 03/26/2019  . Primary osteoarthritis of both hands 03/12/2017  . Primary osteoarthritis of both feet 03/12/2017  . DDD (degenerative disc disease), lumbar 03/12/2017  . Alcohol use/ keep Methotrexate dose low  03/12/2017  . Psoriatic arthritis ([place]) 09/27/2016  . Psoriasis 09/27/2016  . High risk medication use 09/27/2016    Past Medical History:  Diagnosis Date  . Psoriatic arthritis ([place])     Family History  Problem Relation Age of Onset  . Alzheimer's disease Mother   . Hypertension Father   . Cancer Brother        testicular   . Healthy Daughter   . Healthy Son    Past Surgical History:  Procedure Laterality Date  . COLONOSCOPY  08/2019  . KNEE ARTHROPLASTY     Social History   Social History Narrative  . Not on file    There is no immunization history on file for this patient.    Objective: Vital Signs: There were no vitals taken for this visit.   Physical Exam   Musculoskeletal Exam: ***  CDAI Exam: CDAI Score: -- Patient Global: --; Provider Global: -- Swollen: --; Tender: -- Joint Exam 12/21/2019   No joint exam has been documented for this visit   There is currently no information documented on the homunculus. Go to the Rheumatology activity and complete the homunculus joint exam.  Investigation: No additional findings.  Imaging: No results found.  Recent Labs: Lab Results  Component Value Date   WBC 5.5 09/21/2019   HGB 16.1 09/21/2019   PLT 271 09/21/2019   NA 138 09/21/2019   K 4.1 09/21/2019   CL 104 09/21/2019   CO2 24 09/21/2019   GLUCOSE 107 (H) 09/21/2019   BUN 19 09/21/2019   CREATININE 0.88 09/21/2019   BILITOT 0.5 09/21/2019   ALKPHOS 64 05/30/2019   AST 17 09/21/2019   ALT 21 09/21/2019   PROT 7.7 09/21/2019   ALBUMIN 4.3 05/30/2019   CALCIUM 9.9 09/21/2019   GFRAA 114 09/21/2019   QFTBGOLDPLUS NEGATIVE 03/17/2019    Speciality Comments: Prior therapy: Stelara (inadequate response) and Humira (good response but issues with coverage)  Procedures:  No procedures performed Allergies: Patient has no known allergies.   Assessment / Plan:     Visit Diagnoses: No diagnosis found.  Orders: No orders of the defined types were placed in this encounter.  No orders of the defined types were placed in this encounter.   Face-to-face time spent with patient was *** minutes. Greater than  50% of time was spent in counseling and coordination of care.  Follow-Up Instructions: No follow-ups on file.   Earnestine Mealing, CMA  Note - This record has been created using Editor, commissioning.  Chart creation errors have been sought, but may not always  have been located. Such creation errors do not reflect on  the standard of medical care.

## 2019-12-18 NOTE — Progress Notes (Signed)
Office Visit Note  Patient: Mitchell Morris             Date of Birth: 23-May-1966           MRN: [id number]             PCP: Kathyrn Lass, MD Referring: Kathyrn Lass, MD Visit Date: 12/23/2019 Occupation: @GUAROCC @  Subjective:  Pain in both hands  History of Present Illness: Mitchell Morris is a 54 y.o. male with history of psoriatic arthritis and osteoarthritis.  Mitchell Morris is on cosentyx 150 mg sq injections every 14 days, MTX 0.6 ml sq once weekly, and folic acid 2 mg po daily.  Mitchell Morris has not missed any doses of Cosentyx or methotrexate recently.  Mitchell Morris has been tolerating both medications without any side effects.  Mitchell Morris has not had any recent infections.  Mitchell Morris continues to have chronic pain and stiffness in both hands.  His discomfort is worse in the middle the night and first thing in the morning.  Mitchell Morris experiences intermittent joint swelling in his hands.  Mitchell Morris denies any other joint pain or joint swelling at this time.  Mitchell Morris denies any SI joint pain.  Mitchell Morris denies any Achilles denies or plantar fasciitis.  Mitchell Morris has no psoriasis at this time.   Activities of Daily Living:  Patient reports morning stiffness for 15  minutes.   Patient Reports nocturnal pain.  Difficulty dressing/grooming: Denies Difficulty climbing stairs: Denies Difficulty getting out of chair: Denies Difficulty using hands for taps, buttons, cutlery, and/or writing: Denies  Review of Systems  Constitutional: Negative for fatigue and night sweats.  HENT: Negative for mouth sores, mouth dryness and nose dryness.   Eyes: Negative for redness, itching and dryness.  Respiratory: Negative for shortness of breath and difficulty breathing.   Cardiovascular: Negative for chest pain, palpitations, hypertension, irregular heartbeat and swelling in legs/feet.  Gastrointestinal: Negative for blood in stool, constipation and diarrhea.  Endocrine: Negative for increased urination.  Genitourinary: Negative for difficulty urinating and painful  urination.  Musculoskeletal: Positive for arthralgias, joint pain and morning stiffness. Negative for joint swelling, myalgias, muscle weakness, muscle tenderness and myalgias.  Skin: Negative for color change, rash, hair loss, nodules/bumps, redness, skin tightness, ulcers and sensitivity to sunlight.  Allergic/Immunologic: Negative for susceptible to infections.  Neurological: Negative for dizziness, fainting, numbness, headaches, memory loss, night sweats and weakness.  Hematological: Negative for bruising/bleeding tendency and swollen glands.  Psychiatric/Behavioral: Negative for depressed mood, confusion and sleep disturbance. The patient is not nervous/anxious.     PMFS History:  Patient Active Problem List   Diagnosis Date Noted  . Goals of care, counseling/discussion 04/22/2019  . Elevated blood pressure reading 04/22/2019  . MGUS (monoclonal gammopathy of unknown significance) 03/26/2019  . Primary osteoarthritis of both hands 03/12/2017  . Primary osteoarthritis of both feet 03/12/2017  . DDD (degenerative disc disease), lumbar 03/12/2017  . Alcohol use/ keep Methotrexate dose low  03/12/2017  . Psoriatic arthritis ([place]) 09/27/2016  . Psoriasis 09/27/2016  . High risk medication use 09/27/2016    Past Medical History:  Diagnosis Date  . Psoriatic arthritis ([place])     Family History  Problem Relation Age of Onset  . Alzheimer's disease Mother   . Hypertension Father   . Cancer Brother        testicular   . Healthy Daughter   . Healthy Son    Past Surgical History:  Procedure Laterality Date  . COLONOSCOPY  08/2019  . KNEE ARTHROPLASTY  Social History   Social History Narrative  . Not on file    There is no immunization history on file for this patient.   Objective: Vital Signs: BP (!) 145/97 (BP Location: Left Arm, Patient Position: Sitting, Cuff Size: Normal)   Pulse 77   Resp 16   Ht 6' (1.829 m)   Wt 199 lb 12.8 oz (90.6 kg)   BMI 27.10 kg/m     Physical Exam Vitals and nursing note reviewed.  Constitutional:      Appearance: Mitchell Morris is well-developed.  HENT:     Head: Normocephalic and atraumatic.  Eyes:     Conjunctiva/sclera: Conjunctivae normal.     Pupils: Pupils are equal, round, and reactive to light.  Pulmonary:     Effort: Pulmonary effort is normal.  Abdominal:     General: Bowel sounds are normal.     Palpations: Abdomen is soft.  Musculoskeletal:     Cervical back: Normal range of motion and neck supple.  Skin:    General: Skin is warm and dry.     Capillary Refill: Capillary refill takes less than 2 seconds.  Neurological:     Mental Status: Mitchell Morris is alert and oriented to person, place, and time.  Psychiatric:        Behavior: Behavior normal.      Musculoskeletal Exam: C-spine, thoracic spine, and lumbar spine good ROM.  No midline spinal tenderness.  No SI joint tenderness.  Shoulder joints, elbow joints, wrist joints, and MCPs good ROM with no tenderness or synovitis.  Tenderness and synovitis of the left 5th PIP joints.  Hip joints, knee joints, ankle joints, MTPs, PIPs, and DIPs good ROM with no synovitis.  No warmth or effusion of knee joints. No tenderness or swelling of ankle joints.    CDAI Exam: CDAI Score: - Patient Global: -; Provider Global: - Swollen: -; Tender: - Joint Exam 12/23/2019   No joint exam has been documented for this visit   There is currently no information documented on the homunculus. Go to the Rheumatology activity and complete the homunculus joint exam.  Investigation: No additional findings.  Imaging: No results found.  Recent Labs: Lab Results  Component Value Date   WBC 5.5 09/21/2019   HGB 16.1 09/21/2019   PLT 271 09/21/2019   NA 138 09/21/2019   K 4.1 09/21/2019   CL 104 09/21/2019   CO2 24 09/21/2019   GLUCOSE 107 (H) 09/21/2019   BUN 19 09/21/2019   CREATININE 0.88 09/21/2019   BILITOT 0.5 09/21/2019   ALKPHOS 64 05/30/2019   AST 17 09/21/2019   ALT 21  09/21/2019   PROT 7.7 09/21/2019   ALBUMIN 4.3 05/30/2019   CALCIUM 9.9 09/21/2019   GFRAA 114 09/21/2019   QFTBGOLDPLUS NEGATIVE 03/17/2019    Speciality Comments: Prior therapy: Stelara (inadequate response) and Humira (good response but issues with coverage)  Procedures:  No procedures performed Allergies: Patient has no known allergies.   Assessment / Plan:     Visit Diagnoses: Psoriatic arthritis [facility]): Mitchell Morris has tenderness and synovitis of the left fifth PIP joint.  Mitchell Morris has been experiencing severe nocturnal pain in both hands and increased morning stiffness.  Mitchell Morris has difficulty making a complete fist in the morning.  Mitchell Morris performs hand and wrist exercises on a daily basis.  Mitchell Morris is currently on Cosentyx 150 mg subcutaneous injections every 14 days, Rasuvo 15 mg subcutaneous injections once weekly and folic acid 1 mg by mouth daily.  Mitchell Morris has not  had any Achilles tendinitis or plantar fasciitis.  No SI joint tenderness noted on exam.  Mitchell Morris has no active psoriasis.  We discussed increasing the dose of Rasuvo from 15 mg weekly to 20 mg weekly.  Mitchell Morris was advised to reduce alcohol intake.  Mitchell Morris will return for lab work in 2 weeks to monitor for drug toxicity. If his labs are stable Mitchell Morris will continue on Rasuvo 20 mg sq injections once weekly and folic acid 2 mg po daily. Mitchell Morris will continue on Cosentyx as prescribed.  Mitchell Morris was advised to notify us if Mitchell Morris has persistent joint pain or inflammation.  Mitchell Morris will follow-up in the office in 5 months.  Psoriasis: Mitchell Morris has no active psoriasis at this time.  High risk medication use -  Cosentyx 150 mg sq injections every 14 days, Rasuvo 20 mg sq injections every 7 days, and folic acid 1 mg 2 tablets daily.  Restarted cosentyx in March 2021.  CBC and CMP were drawn today to monitor for drug toxicity.  Mitchell Morris will return for lab work in 2 weeks after increasing the dose of Rasuvo from 15 to 20 mg once weekly.  A future order for TB gold was placed today.  Mitchell Morris has not had any recent  infections.  Mitchell Morris was advised to hold Cosentyx and methotrexate if Mitchell Morris develops signs or symptoms of an infection and to resume once the infection has completely cleared.  Mitchell Morris was encouraged to receive the COVID-19 vaccination Mitchell Morris is apprehensive at this time.  Mitchell Morris had the COVID-19 virus in October 2021.- Plan: CBC with Differential/Platelet, COMPLETE METABOLIC PANEL WITH GFR, QuantiFERON-TB Gold Plus  Paresthesia of both hands: Resolved   Primary osteoarthritis of both hands: Mitchell Morris has PIP and DIP thickening consistent with osteoarthritis of both hands.  Mitchell Morris has tenderness and synovitis of the left fifth PIP joint.  Mitchell Morris has been experiencing severe nocturnal pain in both hands as well as morning stiffness.  Mitchell Morris has to perform hand and wrist exercises throughout the day to help with the stiffness.  Joint protection and muscle strengthening were discussed.  Primary osteoarthritis of both feet: Mitchell Morris is not experiencing any discomfort in his feet at this time.  Mitchell Morris wears proper fitting shoes.  Alcohol use/ keep Methotrexate dose low: Mitchell Morris is going to try to cut back on his alcohol use.  Mitchell Morris will be returning in 2 weeks to recheck lab work after increasing the dose of Rasuvo from 15 to 20 mg weekly.  DDD (degenerative disc disease), lumbar: Mitchell Morris has good range of motion with no discomfort.  No midline spinal tenderness.  Has no symptoms of radiculopathy.  Neck stiffness: Mitchell Morris has good range of motion of the C-spine with some discomfort.  Mitchell Morris experiences intermittent neck stiffness.  No symptoms of radiculopathy.  Carpal tunnel syndrome, left upper limb: Resolved.  Abnormal SPEP  Orders: Orders Placed This Encounter  Procedures  . CBC with Differential/Platelet  . COMPLETE METABOLIC PANEL WITH GFR  . QuantiFERON-TB Gold Plus   No orders of the defined types were placed in this encounter.   Face-to-face time spent with patient was 30 minutes. Greater than 50% of time was spent in counseling and coordination of care.   Follow-Up Instructions: Return in about 5 months (around 05/24/2020) for Psoriatic arthritis, Osteoarthritis.   Ofilia Neas, PA-C  Note - This record has been created using Dragon software.  Chart creation errors have been sought, but may not always  have been located. Such creation errors do not  reflect on  the standard of medical care.

## 2019-12-21 ENCOUNTER — Other Ambulatory Visit: Payer: Self-pay | Admitting: Rheumatology

## 2019-12-21 ENCOUNTER — Ambulatory Visit: Payer: Commercial Managed Care - PPO | Admitting: Physician Assistant

## 2019-12-21 NOTE — Telephone Encounter (Signed)
Last Visit: 09/21/2019  Next Visit: 12/23/2019 Labs: 09/21/2019 Glucose is 107. Rest of CMP WNL. CBC WNL  Advised patient he is due to update labs, patient verbalized understanding and will update at appointment on 12/23/2019.   Okay to refill per Dr. Estanislado Pandy.

## 2019-12-23 ENCOUNTER — Ambulatory Visit: Payer: Commercial Managed Care - PPO | Admitting: Physician Assistant

## 2019-12-23 ENCOUNTER — Other Ambulatory Visit: Payer: Self-pay

## 2019-12-23 ENCOUNTER — Encounter: Payer: Self-pay | Admitting: Physician Assistant

## 2019-12-23 ENCOUNTER — Telehealth: Payer: Self-pay

## 2019-12-23 VITALS — BP 145/97 | HR 77 | Resp 16 | Ht 72.0 in | Wt 199.8 lb

## 2019-12-23 DIAGNOSIS — M19042 Primary osteoarthritis, left hand: Secondary | ICD-10-CM

## 2019-12-23 DIAGNOSIS — M19072 Primary osteoarthritis, left ankle and foot: Secondary | ICD-10-CM

## 2019-12-23 DIAGNOSIS — G5602 Carpal tunnel syndrome, left upper limb: Secondary | ICD-10-CM

## 2019-12-23 DIAGNOSIS — R202 Paresthesia of skin: Secondary | ICD-10-CM

## 2019-12-23 DIAGNOSIS — L4050 Arthropathic psoriasis, unspecified: Secondary | ICD-10-CM

## 2019-12-23 DIAGNOSIS — R778 Other specified abnormalities of plasma proteins: Secondary | ICD-10-CM

## 2019-12-23 DIAGNOSIS — M5136 Other intervertebral disc degeneration, lumbar region: Secondary | ICD-10-CM

## 2019-12-23 DIAGNOSIS — Z79899 Other long term (current) drug therapy: Secondary | ICD-10-CM | POA: Diagnosis not present

## 2019-12-23 DIAGNOSIS — Z789 Other specified health status: Secondary | ICD-10-CM

## 2019-12-23 DIAGNOSIS — M19071 Primary osteoarthritis, right ankle and foot: Secondary | ICD-10-CM

## 2019-12-23 DIAGNOSIS — M19041 Primary osteoarthritis, right hand: Secondary | ICD-10-CM

## 2019-12-23 DIAGNOSIS — M51369 Other intervertebral disc degeneration, lumbar region without mention of lumbar back pain or lower extremity pain: Secondary | ICD-10-CM

## 2019-12-23 DIAGNOSIS — L409 Psoriasis, unspecified: Secondary | ICD-10-CM | POA: Diagnosis not present

## 2019-12-23 DIAGNOSIS — M436 Torticollis: Secondary | ICD-10-CM

## 2019-12-23 DIAGNOSIS — F1090 Alcohol use, unspecified, uncomplicated: Secondary | ICD-10-CM

## 2019-12-23 DIAGNOSIS — Z7289 Other problems related to lifestyle: Secondary | ICD-10-CM

## 2019-12-23 NOTE — Progress Notes (Signed)
Medication Samples have been provided to the patient.  Drug name: Rasuvo  Strength: 20mg      Qty: 2 [id number] AB, [id number] AB Exp.Date: 12/2019, 09/2020  Dosing instructions: Inject 20mg  into the skin once weekly.   The patient has been instructed regarding the correct time, dose, and frequency of taking this medication, including desired effects and most common side effects.   Earnestine Mealing 9:12 AM 12/23/2019

## 2019-12-23 NOTE — Telephone Encounter (Signed)
Patient was provided with two samples of Rasuvo 20mg  today. Per Hazel Sams, PA-C patient will have labs drawn in 2 weeks and pending lab results, we can send in prescription for Rasuvo 20mg .

## 2019-12-23 NOTE — Patient Instructions (Addendum)
Standing Labs We placed an order today for your standing lab work.    Please come back and get your standing labs in 2 weeks   We have open lab daily Monday through Thursday from 8:30-12:30 PM and 1:30-4:30 PM and Friday from 8:30-12:30 PM and 1:30-4:00 PM at the office of Dr. Bo Merino.   You may experience shorter wait times on Monday and Friday afternoons. The office is located at [address] [place], [place], [place] [address] No appointment is necessary.   Labs are drawn by [facility].  You may receive a bill from [facility] for your lab work.  If you wish to have your labs drawn at another location, please call the office 24 hours in advance to send orders.  If you have any questions regarding directions or hours of operation,  please call [phone].   Just as a reminder please drink plenty of water prior to coming for your lab work. Thanks!

## 2019-12-24 LAB — COMPLETE METABOLIC PANEL WITH GFR
AG Ratio: 1.6 (calc) (ref 1.0–2.5)
ALT: 26 U/L (ref 9–46)
AST: 22 U/L (ref 10–35)
Albumin: 4.4 g/dL (ref 3.6–5.1)
Alkaline phosphatase (APISO): 75 U/L (ref 35–144)
BUN: 15 mg/dL (ref 7–25)
CO2: 28 mmol/L (ref 20–32)
Calcium: 9.4 mg/dL (ref 8.6–10.3)
Chloride: 104 mmol/L (ref 98–110)
Creat: 1.06 mg/dL (ref 0.70–1.33)
GFR, Est African American: 92 mL/min/{1.73_m2} (ref >=60)
GFR, Est Non African American: 80 mL/min/{1.73_m2} (ref >=60)
Globulin: 2.8 g/dL (calc) (ref 1.9–3.7)
Glucose, Bld: 101 mg/dL — ABNORMAL HIGH (ref 65–99)
Potassium: 4.5 mmol/L (ref 3.5–5.3)
Sodium: 139 mmol/L (ref 135–146)
Total Bilirubin: 0.7 mg/dL (ref 0.2–1.2)
Total Protein: 7.2 g/dL (ref 6.1–8.1)

## 2019-12-24 LAB — CBC WITH DIFFERENTIAL/PLATELET
Absolute Monocytes: 519 cells/uL (ref 200–950)
Basophils Absolute: 29 cells/uL (ref 0–200)
Basophils Relative: 0.6 %
Eosinophils Absolute: 20 cells/uL (ref 15–500)
Eosinophils Relative: 0.4 %
HCT: 44.9 % (ref 38.5–50.0)
Hemoglobin: 15 g/dL (ref 13.2–17.1)
Lymphs Abs: 1328 cells/uL (ref 850–3900)
MCH: 29.9 pg (ref 27.0–33.0)
MCHC: 33.4 g/dL (ref 32.0–36.0)
MCV: 89.4 fL (ref 80.0–100.0)
MPV: 10 fL (ref 7.5–12.5)
Monocytes Relative: 10.6 %
Neutro Abs: 3004 cells/uL (ref 1500–7800)
Neutrophils Relative %: 61.3 %
Platelets: 255 10*3/uL (ref 140–400)
RBC: 5.02 10*6/uL (ref 4.20–5.80)
RDW: 14 % (ref 11.0–15.0)
Total Lymphocyte: 27.1 %
WBC: 4.9 10*3/uL (ref 3.8–10.8)

## 2019-12-24 NOTE — Progress Notes (Signed)
CBC and CMP WNL. Ok to proceed with Rasuvo 20 mg sq weekly injections.  Please advise the patient to return for labs 2 weeks after increasing the dose of Rasuvo.

## 2020-01-06 NOTE — Telephone Encounter (Signed)
Left message to remind patient to have labs drawn as we increased Rasuvo 2 weeks ago.

## 2020-01-13 NOTE — Telephone Encounter (Signed)
Left message to remind patient to have labs drawn as we increased Rasuvo 2 weeks ago.

## 2020-01-14 ENCOUNTER — Other Ambulatory Visit: Payer: Self-pay | Admitting: *Deleted

## 2020-01-14 DIAGNOSIS — Z79899 Other long term (current) drug therapy: Secondary | ICD-10-CM

## 2020-01-14 LAB — CBC WITH DIFFERENTIAL/PLATELET
Absolute Monocytes: 538 cells/uL (ref 200–950)
Basophils Absolute: 21 cells/uL (ref 0–200)
Basophils Relative: 0.5 %
Eosinophils Absolute: 38 cells/uL (ref 15–500)
Eosinophils Relative: 0.9 %
HCT: 43.4 % (ref 38.5–50.0)
Hemoglobin: 14.6 g/dL (ref 13.2–17.1)
Lymphs Abs: 1420 cells/uL (ref 850–3900)
MCH: 29.9 pg (ref 27.0–33.0)
MCHC: 33.6 g/dL (ref 32.0–36.0)
MCV: 88.8 fL (ref 80.0–100.0)
MPV: 9.8 fL (ref 7.5–12.5)
Monocytes Relative: 12.8 %
Neutro Abs: 2184 cells/uL (ref 1500–7800)
Neutrophils Relative %: 52 %
Platelets: 266 10*3/uL (ref 140–400)
RBC: 4.89 10*6/uL (ref 4.20–5.80)
RDW: 13.6 % (ref 11.0–15.0)
Total Lymphocyte: 33.8 %
WBC: 4.2 10*3/uL (ref 3.8–10.8)

## 2020-01-14 LAB — COMPLETE METABOLIC PANEL WITH GFR
AG Ratio: 1.6 (calc) (ref 1.0–2.5)
ALT: 22 U/L (ref 9–46)
AST: 16 U/L (ref 10–35)
Albumin: 4.5 g/dL (ref 3.6–5.1)
Alkaline phosphatase (APISO): 73 U/L (ref 35–144)
BUN: 18 mg/dL (ref 7–25)
CO2: 30 mmol/L (ref 20–32)
Calcium: 9.6 mg/dL (ref 8.6–10.3)
Chloride: 105 mmol/L (ref 98–110)
Creat: 0.89 mg/dL (ref 0.70–1.33)
GFR, Est African American: 113 mL/min/{1.73_m2} (ref >=60)
GFR, Est Non African American: 98 mL/min/{1.73_m2} (ref >=60)
Globulin: 2.9 g/dL (calc) (ref 1.9–3.7)
Glucose, Bld: 103 mg/dL — ABNORMAL HIGH (ref 65–99)
Potassium: 4.9 mmol/L (ref 3.5–5.3)
Sodium: 139 mmol/L (ref 135–146)
Total Bilirubin: 0.7 mg/dL (ref 0.2–1.2)
Total Protein: 7.4 g/dL (ref 6.1–8.1)

## 2020-01-15 ENCOUNTER — Telehealth: Payer: Self-pay | Admitting: *Deleted

## 2020-01-15 MED ORDER — RASUVO 20 MG/0.4ML ~~LOC~~ SOAJ: 20.0000 mg | SUBCUTANEOUS | 0 refills | Status: DC

## 2020-01-15 NOTE — Telephone Encounter (Signed)
-----   Message from Bo Merino, MD sent at 01/15/2020  2:27 PM EDT ----- CBC and CMP are normal.

## 2020-01-15 NOTE — Telephone Encounter (Signed)
Last visit: 12/23/2019 Next Visit: 05/25/2020 Labs: 01/14/2020 WNL  Patient has had two doses of the Rasuvo 20 mg. Patient returned for 2 week labs. Patient needs prescription sent to [facility].  Please send prescription to the pharmacy.

## 2020-01-15 NOTE — Progress Notes (Signed)
CBC and CMP are normal.

## 2020-01-28 ENCOUNTER — Telehealth: Payer: Self-pay | Admitting: Pharmacy Technician

## 2020-01-28 NOTE — Telephone Encounter (Signed)
Received notification from [employer] regarding a prior authorization for RASUVO. Authorization has been APPROVED from 01/28/20 to 03/17/21.   Phone# [phone]

## 2020-01-28 NOTE — Telephone Encounter (Signed)
Submitted a Prior Authorization request to [employer] for RASUVO via Cover My Meds. Will update once we receive a response.   (Key: [id number])

## 2020-02-11 ENCOUNTER — Telehealth: Payer: Self-pay | Admitting: Rheumatology

## 2020-02-11 NOTE — Telephone Encounter (Signed)
Patient called stating he received a call from the specialty pharmacy stating his copay card has expired for 2021 for Cosentyx and Rasuvo and that he will need to apply for financial assistance.  Patient states "he remembers having to do the same thing last year for his prescriptions."   Patient is requesting a return call.

## 2020-02-11 NOTE — Telephone Encounter (Signed)
Called pharmacy and confirmed that patient's Cosentyx copay card funds has been exhausted. This happened last year also, and patient enrolled into [employer] patient assistance.   Pharmacy stated that patient's Rasuvo copay card is still working- but if he ends up exhausting the funds for that- we will need to switch him back to .   Called patient, left message that we will mail him an [employer] patient assistance application and to contact office if he needs a sample.

## 2020-03-11 ENCOUNTER — Telehealth: Payer: Self-pay | Admitting: Rheumatology

## 2020-03-11 NOTE — Telephone Encounter (Signed)
Margreta Journey from [employer] patient assistance left a voicemail stating a prior authorization may be needed for patient's Cosentyx.  Margreta Journey states it can be initiated through patient's insurance [employer].  If you have any questions, please call [phone]

## 2020-03-14 NOTE — Telephone Encounter (Signed)
Called [employer] and provided patient's PA approval info through 10/23/20. They will call [employer] to verify, then will make determination.

## 2020-03-21 NOTE — Telephone Encounter (Signed)
Received a fax from  [employer] regarding an approval for [place] from 03/17/20 to 07/29/20.   Patient ID: [id number]  Mariella Saa, PharmD, BCACP, CPP Clinical Specialty Pharmacist (Rheumatology and Pulmonology)  03/21/2020 11:05 AM

## 2020-04-05 ENCOUNTER — Other Ambulatory Visit: Payer: Self-pay | Admitting: Rheumatology

## 2020-04-05 NOTE — Telephone Encounter (Signed)
Last Visit: 12/23/2019 Next Visit: 05/25/2020 Labs: 01/14/2020 CBC and CMP are normal.  Current Dose per office note 12/23/2019: Rasuvo 20 mg sq injections once weekly DX: Psoriatic arthritis   Okay to refill per Dr. Estanislado Pandy

## 2020-04-14 ENCOUNTER — Inpatient Hospital Stay: Payer: Commercial Managed Care - PPO

## 2020-04-21 ENCOUNTER — Inpatient Hospital Stay: Payer: Commercial Managed Care - PPO | Attending: Internal Medicine

## 2020-04-21 ENCOUNTER — Ambulatory Visit: Payer: Commercial Managed Care - PPO | Admitting: Internal Medicine

## 2020-04-21 ENCOUNTER — Other Ambulatory Visit: Payer: Self-pay

## 2020-04-21 DIAGNOSIS — L4050 Arthropathic psoriasis, unspecified: Secondary | ICD-10-CM | POA: Diagnosis not present

## 2020-04-21 DIAGNOSIS — Z79899 Other long term (current) drug therapy: Secondary | ICD-10-CM | POA: Diagnosis not present

## 2020-04-21 DIAGNOSIS — R768 Other specified abnormal immunological findings in serum: Secondary | ICD-10-CM | POA: Insufficient documentation

## 2020-04-21 DIAGNOSIS — D472 Monoclonal gammopathy: Secondary | ICD-10-CM | POA: Diagnosis not present

## 2020-04-21 LAB — CBC WITH DIFFERENTIAL (CANCER CENTER ONLY)
Abs Immature Granulocytes: 0.02 10*3/uL (ref 0.00–0.07)
Basophils Absolute: 0 10*3/uL (ref 0.0–0.1)
Basophils Relative: 0 %
Eosinophils Absolute: 0 10*3/uL (ref 0.0–0.5)
Eosinophils Relative: 0 %
HCT: 43.4 % (ref 39.0–52.0)
Hemoglobin: 14.7 g/dL (ref 13.0–17.0)
Immature Granulocytes: 0 %
Lymphocytes Relative: 28 %
Lymphs Abs: 1.3 10*3/uL (ref 0.7–4.0)
MCH: 30.1 pg (ref 26.0–34.0)
MCHC: 33.9 g/dL (ref 30.0–36.0)
MCV: 88.9 fL (ref 80.0–100.0)
Monocytes Absolute: 0.3 10*3/uL (ref 0.1–1.0)
Monocytes Relative: 6 %
Neutro Abs: 3.1 10*3/uL (ref 1.7–7.7)
Neutrophils Relative %: 66 %
Platelet Count: 237 10*3/uL (ref 150–400)
RBC: 4.88 MIL/uL (ref 4.22–5.81)
RDW: 13.2 % (ref 11.5–15.5)
WBC Count: 4.8 10*3/uL (ref 4.0–10.5)
nRBC: 0 % (ref 0.0–0.2)

## 2020-04-21 LAB — CMP (CANCER CENTER ONLY)
ALT: 36 U/L (ref 0–44)
AST: 22 U/L (ref 15–41)
Albumin: 4.1 g/dL (ref 3.5–5.0)
Alkaline Phosphatase: 74 U/L (ref 38–126)
Anion gap: 4 — ABNORMAL LOW (ref 5–15)
BUN: 17 mg/dL (ref 6–20)
CO2: 29 mmol/L (ref 22–32)
Calcium: 9.3 mg/dL (ref 8.9–10.3)
Chloride: 105 mmol/L (ref 98–111)
Creatinine: 0.85 mg/dL (ref 0.61–1.24)
GFR, Est AFR Am: >60 mL/min (ref >60)
GFR, Estimated: >60 mL/min (ref >60)
Glucose, Bld: 135 mg/dL — ABNORMAL HIGH (ref 70–99)
Potassium: 4 mmol/L (ref 3.5–5.1)
Sodium: 138 mmol/L (ref 135–145)
Total Bilirubin: 0.7 mg/dL (ref 0.3–1.2)
Total Protein: 7.8 g/dL (ref 6.5–8.1)

## 2020-04-21 LAB — LACTATE DEHYDROGENASE: LDH: 157 U/L (ref 98–192)

## 2020-04-22 LAB — IGG, IGA, IGM
IgA: 490 mg/dL — ABNORMAL HIGH (ref 90–386)
IgG (Immunoglobin G), Serum: 1142 mg/dL (ref 603–1613)
IgM (Immunoglobulin M), Srm: 165 mg/dL (ref 20–172)

## 2020-04-22 LAB — BETA 2 MICROGLOBULIN, SERUM: Beta-2 Microglobulin: 1.3 mg/L (ref 0.6–2.4)

## 2020-04-22 LAB — KAPPA/LAMBDA LIGHT CHAINS
Kappa free light chain: 15.1 mg/L (ref 3.3–19.4)
Kappa, lambda light chain ratio: 0.04 — ABNORMAL LOW (ref 0.26–1.65)
Lambda free light chains: 378.7 mg/L — ABNORMAL HIGH (ref 5.7–26.3)

## 2020-04-28 ENCOUNTER — Encounter: Payer: Self-pay | Admitting: Internal Medicine

## 2020-04-28 ENCOUNTER — Other Ambulatory Visit: Payer: Self-pay

## 2020-04-28 ENCOUNTER — Inpatient Hospital Stay (HOSPITAL_BASED_OUTPATIENT_CLINIC_OR_DEPARTMENT_OTHER): Payer: Commercial Managed Care - PPO | Admitting: Internal Medicine

## 2020-04-28 VITALS — BP 151/84 | HR 97 | Temp 97.2°F | Resp 20 | Ht 72.0 in | Wt 201.8 lb

## 2020-04-28 DIAGNOSIS — D472 Monoclonal gammopathy: Secondary | ICD-10-CM

## 2020-04-28 NOTE — Progress Notes (Signed)
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   PRIOR THERAPY: None  CURRENT THERAPY: Observation   INTERVAL HISTORY: Mitchell Morris 54 y.o. male returns to the clinic today for annual follow-up visit.  The patient is feeling fine today with no concerning complaints except for increasing pain and limitation of movement in his wrists bilaterally especially at nighttime.  He is followed by Dr. Shanon Brow for and currently on treatment with methotrexate and Cosentyx.  He denied having any current chest pain, shortness of breath, cough or hemoptysis.  He denied having any fever or chills.  He has no nausea, vomiting, diarrhea or constipation.  He has no headache or visual changes.  The patient is here today for evaluation after repeating myeloma panel.  MEDICAL HISTORY: Past Medical History:  Diagnosis Date  . Psoriatic arthritis ([place])     ALLERGIES:  has No Known Allergies.  MEDICATIONS:  Current Outpatient Medications  Medication Sig Dispense Refill  . folic acid (FOLVITE) 1 MG tablet Take 1 tablet (1 mg total) by mouth daily. 120 tablet 2  . OVER THE COUNTER MEDICATION daily.    Marland Kitchen RASUVO 20 MG/0.4ML SOAJ Inject 20 mg into the skin once a week. 4 mL 0  . Secukinumab, 300 MG Dose, (COSENTYX SENSOREADY, 300 MG,) 150 MG/ML SOAJ Inject 150 mg into the skin every 14 (fourteen) days. 6 pen 0   No current facility-administered medications for this visit.    SURGICAL HISTORY:  Past Surgical History:  Procedure Laterality Date  . COLONOSCOPY  08/2019  . KNEE ARTHROPLASTY      REVIEW OF SYSTEMS:  A comprehensive review of systems was negative except for: Musculoskeletal: positive for arthralgias and stiff joints   PHYSICAL EXAMINATION: General  appearance: alert, cooperative and no distress Head: Normocephalic, without obvious abnormality, atraumatic Neck: no adenopathy, no JVD, supple, symmetrical, trachea midline and thyroid not enlarged, symmetric, no tenderness/mass/nodules Lymph nodes: Cervical, supraclavicular, and axillary nodes normal. Resp: clear to auscultation bilaterally Back: symmetric, no curvature. ROM normal. No CVA tenderness. Cardio: regular rate and rhythm, S1, S2 normal, no murmur, click, rub or gallop GI: soft, non-tender; bowel sounds normal; no masses,  no organomegaly Extremities: extremities normal, atraumatic, no cyanosis or edema  ECOG PERFORMANCE STATUS: 1 - Symptomatic but completely ambulatory  Blood pressure (!) 151/84, pulse 97, temperature (!) 97.2 F (36.2 C), temperature source Tympanic, resp. rate 20, height 6' (1.829 m), weight 201 lb 12.8 oz (91.5 kg), SpO2 97 %.  LABORATORY DATA: Lab Results  Component Value Date   WBC 4.8 04/21/2020   HGB 14.7 04/21/2020   HCT 43.4 04/21/2020   MCV 88.9 04/21/2020   PLT 237 04/21/2020      Chemistry      Component Value Date/Time   NA 138 04/21/2020 0949   K 4.0 04/21/2020 0949   CL 105 04/21/2020 0949   CO2 29 04/21/2020 0949   BUN 17 04/21/2020 0949   CREATININE 0.85 04/21/2020 0949   CREATININE 0.89 01/14/2020 0937      Component Value Date/Time   CALCIUM 9.3 04/21/2020 0949   ALKPHOS 74 04/21/2020 0949   AST 22 04/21/2020 0949   ALT 36 04/21/2020 0949   BILITOT 0.7 04/21/2020 0949       RADIOGRAPHIC STUDIES: No  results found.  ASSESSMENT AND PLAN: This is a very pleasant 54 years old white male with history of monoclonal gammopathy of undetermined significance and currently on observation. The patient is feeling fine today with no concerning complaints except for the worsening inflammation and arthralgia of his wrists. He is currently on treatment with methotrexate and Cosentyx by his rheumatologist. His repeat myeloma panel  showed mild increase in the free lambda light chain and IgG. I recommended for the patient to continue on observation with repeat myeloma panel in 1 year. He was advised to call immediately if he has any concerning symptoms in the interval. The patient voices understanding of current disease status and treatment options and is in agreement with the current care plan.  All questions were answered. The patient knows to call the clinic with any problems, questions or concerns. We can certainly see the patient much sooner if necessary.   Disclaimer: This note was dictated with voice recognition software. Similar sounding words can inadvertently be transcribed and may not be corrected upon review.

## 2020-05-06 ENCOUNTER — Telehealth: Payer: Self-pay | Admitting: Internal Medicine

## 2020-05-06 NOTE — Telephone Encounter (Signed)
Called and left msg about added appt. Mailed printout  

## 2020-05-11 NOTE — Progress Notes (Signed)
Office Visit Note  Patient: Mitchell Morris             Date of Birth: Dec 13, 1965           MRN: [id number]             PCP: Kathyrn Lass, MD Referring: Kathyrn Lass, MD Visit Date: 05/25/2020 Occupation: @GUAROCC @  Subjective:  Pain in both hands   History of Present Illness: Mitchell Morris is a 54 y.o. male with history of psoriatic arthritis and osteoarthritis.  Patient is on Cosentyx 150 mg sq injections every 14 days and Rasuvo 20 mg subcutaneous injections once weekly.  His last injection was 3 weeks ago due to needing a refill of Cosentyx.  He has not missed any doses of Rasuvo.  Patient reports that he has been experiencing severe pain and stiffness in both hands and both wrist joints on a daily basis.  He has had interrupted sleep at night due to nocturnal pain.  He is unable to fully open or close his hands in the middle of the night.  He reports that the pain in his hands has been interfering with his ADLs and his children often have to assist him.  He states he is also been unable to golf recently which has been disappointing.  He denies any Achilles tendinitis or plantar fasciitis.  He has no SI joint pain at this time.  He experiences intermittent pain and stiffness in both knee joints but denies any joint swelling currently.  He denies any active psoriasis.  He reports he needs to make a medication change because his pain and stiffness are significantly effecting his quality of life.  He denies any recent infections.  He has not received the covid-19 vaccination and does not plan to at this time.       Activities of Daily Living:  Patient reports morning stiffness for 30  minutes.   Patient Reports nocturnal pain.  Difficulty dressing/grooming: Denies Difficulty climbing stairs: Denies Difficulty getting out of chair: Denies Difficulty using hands for taps, buttons, cutlery, and/or writing: Denies  Review of Systems  Constitutional: Negative for fatigue.  HENT:  Negative for mouth sores, mouth dryness and nose dryness.   Eyes: Negative for pain, itching and dryness.  Respiratory: Negative for shortness of breath and difficulty breathing.   Cardiovascular: Negative for chest pain and palpitations.  Gastrointestinal: Negative for blood in stool, constipation and diarrhea.  Endocrine: Negative for increased urination.  Genitourinary: Negative for difficulty urinating.  Musculoskeletal: Positive for arthralgias, joint pain and morning stiffness. Negative for joint swelling, myalgias, muscle tenderness and myalgias.  Skin: Negative for color change, rash and redness.  Allergic/Immunologic: Negative for susceptible to infections.  Neurological: Negative for dizziness, numbness, headaches, memory loss and weakness.  Hematological: Positive for bruising/bleeding tendency.  Psychiatric/Behavioral: Negative for confusion.    PMFS History:  Patient Active Problem List   Diagnosis Date Noted  . Goals of care, counseling/discussion 04/22/2019  . Elevated blood pressure reading 04/22/2019  . MGUS (monoclonal gammopathy of unknown significance) 03/26/2019  . Primary osteoarthritis of both hands 03/12/2017  . Primary osteoarthritis of both feet 03/12/2017  . DDD (degenerative disc disease), lumbar 03/12/2017  . Alcohol use/ keep Methotrexate dose low  03/12/2017  . Psoriatic arthritis ([place]) 09/27/2016  . Psoriasis 09/27/2016  . High risk medication use 09/27/2016    Past Medical History:  Diagnosis Date  . Psoriatic arthritis ([place])     Family History  Problem Relation Age  of Onset  . Alzheimer's disease Mother   . Hypertension Father   . Cancer Brother        testicular   . Healthy Daughter   . Healthy Son    Past Surgical History:  Procedure Laterality Date  . COLONOSCOPY  08/2019  . KNEE ARTHROPLASTY     Social History   Social History Narrative  . Not on file    There is no immunization history on file for this patient.    Objective: Vital Signs: BP (!) 159/97 (BP Location: Left Arm, Patient Position: Sitting, Cuff Size: Normal)   Pulse 82   Resp 15   Ht 6' (1.829 m)   Wt 200 lb 9.6 oz (91 kg)   BMI 27.21 kg/m    Physical Exam Vitals and nursing note reviewed.  Constitutional:      Appearance: He is well-developed.  HENT:     Head: Normocephalic and atraumatic.  Eyes:     Conjunctiva/sclera: Conjunctivae normal.     Pupils: Pupils are equal, round, and reactive to light.  Pulmonary:     Effort: Pulmonary effort is normal.  Abdominal:     Palpations: Abdomen is soft.  Musculoskeletal:     Cervical back: Normal range of motion and neck supple.  Skin:    General: Skin is warm and dry.     Capillary Refill: Capillary refill takes less than 2 seconds.  Neurological:     Mental Status: He is alert and oriented to person, place, and time.  Psychiatric:        Behavior: Behavior normal.      Musculoskeletal Exam: C-spine, thoracic spine, and lumbar spine good ROM.  Shoulder joints and elbow joints good ROM.  Painful ROM of both wrist joints.  Tenderness over both wrists.  Ganglion cyst on radial aspect of the right wrist.  Tenderness and synovitis of the right 2nd MCP, 3rd PIP, and 2nd DIP joint.  Painful fist formation.  PIP and DIP thickening noted.  Hip joints, knee joints, and ankle joints good ROM with no discomfort.  No warmth or effusion of knee joints.  No tenderness or swelling of ankle joints.   CDAI Exam: CDAI Score: -- Patient Global: --; Provider Global: -- Swollen: 3 ; Tender: 5  Joint Exam 05/25/2020      Right  Left  Wrist   Tender   Tender  MCP 2  Swollen Tender     PIP 3  Swollen Tender     DIP 3  Swollen Tender        Investigation: No additional findings.  Imaging: No results found.  Recent Labs: Lab Results  Component Value Date   WBC 4.8 04/21/2020   HGB 14.7 04/21/2020   PLT 237 04/21/2020   NA 138 04/21/2020   K 4.0 04/21/2020   CL 105 04/21/2020    CO2 29 04/21/2020   GLUCOSE 135 (H) 04/21/2020   BUN 17 04/21/2020   CREATININE 0.85 04/21/2020   BILITOT 0.7 04/21/2020   ALKPHOS 74 04/21/2020   AST 22 04/21/2020   ALT 36 04/21/2020   PROT 7.8 04/21/2020   ALBUMIN 4.1 04/21/2020   CALCIUM 9.3 04/21/2020   GFRAA >60 04/21/2020   QFTBGOLDPLUS NEGATIVE 03/17/2019    Speciality Comments: Prior therapy: Stelara (inadequate response) and Humira (good response but issues with coverage)  Procedures:  No procedures performed Allergies: Patient has no known allergies.   Assessment / Plan:     Visit Diagnoses: Psoriatic arthritis ([place]):  He has been experiencing severe pain and stiffness in both hands and both wrist joints over the past several months.  He has had difficulty with ADLs and severe nocturnal pain.  He has tenderness and synovitis of the right second MCP, third PIP, and second DIP joint.  He has difficulty making a complete fist due to the severity of pain and stiffness in both hands.  He has tenderness of both wrist joints on examination today.  The discomfort he has been experiencing has been interfering with his quality of life and he would like to discuss different treatment options today.  Indications, contraindications, potential side effects of Taltz were discussed today.  All questions were addressed and consent was obtained.  We will apply for Taltz through his insurance and once approved she will return to the office for the administration of the first injection.  His dose will be 160 mg subcutaneous injection weeks 0 followed by 80 mg injections every 4 weeks.  He will continue on Rasuvo and folic acid as prescribed.  He declined a prednisone taper today.  In the meantime he was advised to try arthritis compression gloves at night.  He plans on continuing to avoid over-the-counter products for pain relief.  He will follow-up in the office in 6 to 8 weeks to assess his response.  Medication counseling:  Baseline  Immunosuppressant Therapy Labs TB GOLD Quantiferon TB Gold Latest Ref Rng & Units 03/17/2019  Quantiferon TB Gold Plus NEGATIVE NEGATIVE   Hepatitis Panel   HIV Lab Results  Component Value Date   HIV NON-REACTIVE 03/17/2019   Immunoglobulins Immunoglobulin Electrophoresis Latest Ref Rng & Units 04/21/2020  IgA  47 - 310 mg/dL -  IgG 603 - 1,613 mg/dL 1,142  IgM 20 - 172 mg/dL 165   SPEP Serum Protein Electrophoresis Latest Ref Rng & Units 04/21/2020  Total Protein 6.5 - 8.1 g/dL 7.8  Albumin 3.8 - 4.8 g/dL -  Alpha-1 0.2 - 0.3 g/dL -  Alpha-2 0.5 - 0.9 g/dL -  Beta Globulin 0.4 - 0.6 g/dL -  Beta 2 0.2 - 0.5 g/dL -  Gamma Globulin 0.8 - 1.7 g/dL -   G6PD No results found for: G6PDH TPMT No results found for: TPMT   Chest Xray: mild bibasilar atelectasis on 05/30/19   Does patient have a history of inflammatory bowel disease? No  Counseled patient that Donnetta Hail is a IL-17 inhibitor that works to reduce pain and inflammation associated with arthritis.  Counseled patient on purpose, proper use, and adverse effects of Taltz. Reviewed the most common adverse effects of infection, inflammatory bowel disease, and allergic reaction. Counseled patient that Donnetta Hail should be held for infection and prior to scheduled surgery.  Counseled patient to avoid live vaccines while on Taltz.  Advised patient to get annual influenza vaccine, pneumococcal vaccine, and Shingrix as indicated.  Reviewed storage information for Taltz.  Reviewed the importance of regular labs while on [place]. Standing orders placed and is to return in 1 month and then every 3 months after initiation.  Provided patient with medication education material and answered all questions.  Patient consented to [place].  Will upload consent into patient's chart.  Will apply for Taltz through patient's insurance and update when we receive a response.  Advised initial injection must be administered in office.  Patient voiced understanding.     Taltz dose will be: For psoriatic arthritis load of 160 mg then 80 mg every 28 days  Prescription will be sent to pharmacy  pending lab results and insurance approval.  Psoriasis: He has no active psoriasis at this time.  High risk medication use -Applying for Taltz.   He will continue on Rasuvo 20 mg sq injections every 7 days and folic acid 1 mg 2 tablets daily.  Inadequate response to Cosentyx and stelara.  Discontinued Humira due to issues with coverage. - Plan: QuantiFERON-TB Gold Plus He has not had any recent infections.  Discussed the importance of holding Taltz if he develops signs or symptoms of an infection and to resume once infection has completely cleared.  He has not received the COVID-19 vaccinations and does not plan to at this time.  He was given an informational handout about the COVID-19 vaccinations today.  Paresthesia of both hands - Resolved   Primary osteoarthritis of both hands: He has PIP and DIP thickening consistent with osteoarthritis of both hands.  He has tenderness and inflammation of the right third PIP and second DIP joint.  He has difficulty making a complete fist due to the discomfort and stiffness in both hands.  Joint protection and muscle strengthening were discussed.  We discussed the use of arthritis compression gloves at night.  Primary osteoarthritis of both feet: He is not experiencing any discomfort in his feet at this time.  He wears proper fitting shoes.  He has good range of motion of both ankle joints with no tenderness or inflammation.  No tenderness along the Achilles tendon or plantar fascia.  Alcohol use/ keep Methotrexate dose low   DDD (degenerative disc disease), lumbar: He is not experiencing any discomfort in his lower back at this time.  He has no midline spinal tenderness or SI joint tenderness at this time.  No symptoms of radiculopathy.  Neck stiffness: He has not been experiencing any increased neck pain or stiffness recently.  He has  no symptoms of radiculopathy.  He has good range of motion on examination today.  Carpal tunnel syndrome, left upper limb - Resolved.  Abnormal SPEP: He was recently evaluated by his hematologist and had updated lab work on 04/21/2020.  Screening for tuberculosis - TB gold negative on 03/17/2019.  He is due to update TB Gold today.  Order for TB gold was released.  Plan: QuantiFERON-TB Gold Plus  Orders: Orders Placed This Encounter  Procedures  . QuantiFERON-TB Gold Plus   No orders of the defined types were placed in this encounter.   Follow-Up Instructions: Return in about 6 weeks (around 07/06/2020) for Psoriatic arthritis, Osteoarthritis.   Ofilia Neas, PA-C  Note - This record has been created using Dragon software.  Chart creation errors have been sought, but may not always  have been located. Such creation errors do not reflect on  the standard of medical care.

## 2020-05-25 ENCOUNTER — Other Ambulatory Visit: Payer: Self-pay

## 2020-05-25 ENCOUNTER — Ambulatory Visit: Payer: Commercial Managed Care - PPO | Admitting: Physician Assistant

## 2020-05-25 ENCOUNTER — Telehealth: Payer: Self-pay

## 2020-05-25 ENCOUNTER — Encounter: Payer: Self-pay | Admitting: Physician Assistant

## 2020-05-25 VITALS — BP 159/97 | HR 82 | Resp 15 | Ht 72.0 in | Wt 200.6 lb

## 2020-05-25 DIAGNOSIS — Z7289 Other problems related to lifestyle: Secondary | ICD-10-CM

## 2020-05-25 DIAGNOSIS — R202 Paresthesia of skin: Secondary | ICD-10-CM | POA: Diagnosis not present

## 2020-05-25 DIAGNOSIS — M19072 Primary osteoarthritis, left ankle and foot: Secondary | ICD-10-CM

## 2020-05-25 DIAGNOSIS — G5602 Carpal tunnel syndrome, left upper limb: Secondary | ICD-10-CM

## 2020-05-25 DIAGNOSIS — Z79899 Other long term (current) drug therapy: Secondary | ICD-10-CM | POA: Diagnosis not present

## 2020-05-25 DIAGNOSIS — M436 Torticollis: Secondary | ICD-10-CM

## 2020-05-25 DIAGNOSIS — L4050 Arthropathic psoriasis, unspecified: Secondary | ICD-10-CM

## 2020-05-25 DIAGNOSIS — L409 Psoriasis, unspecified: Secondary | ICD-10-CM | POA: Diagnosis not present

## 2020-05-25 DIAGNOSIS — R778 Other specified abnormalities of plasma proteins: Secondary | ICD-10-CM

## 2020-05-25 DIAGNOSIS — Z111 Encounter for screening for respiratory tuberculosis: Secondary | ICD-10-CM

## 2020-05-25 DIAGNOSIS — M5136 Other intervertebral disc degeneration, lumbar region: Secondary | ICD-10-CM

## 2020-05-25 DIAGNOSIS — Z789 Other specified health status: Secondary | ICD-10-CM

## 2020-05-25 DIAGNOSIS — M19042 Primary osteoarthritis, left hand: Secondary | ICD-10-CM

## 2020-05-25 DIAGNOSIS — M19071 Primary osteoarthritis, right ankle and foot: Secondary | ICD-10-CM

## 2020-05-25 DIAGNOSIS — M19041 Primary osteoarthritis, right hand: Secondary | ICD-10-CM

## 2020-05-25 NOTE — Telephone Encounter (Signed)
Please apply for taltz, per Taylor Dale, PA-C. Thanks!   Consent obtained and sent to the scan center.  

## 2020-05-25 NOTE — Telephone Encounter (Signed)
Submitted a Prior Authorization request to [employer] for TALTZ via [employer] My Meds. Will update once we receive a response.   (Key: [id number])

## 2020-05-25 NOTE — Patient Instructions (Addendum)
Ixekizumab injection What is this medicine? IXEKIZUMAB (ix e KIZ ue mab) is used to treat plaque psoriasis, psoriatic arthritis, ankylosing spondylitis, and active non-radiographic axial spondyloarthritis. This medicine may be used for other purposes; ask your health care provider or pharmacist if you have questions. COMMON BRAND NAME(S): TALTZ What should I tell my health care provider before I take this medicine? They need to know if you have any of these conditions:  immune system problems  infection (especially a viral infection such as chickenpox, cold sores, or herpes)  recently received or are scheduled to receive a vaccine  tuberculosis, a positive skin test for tuberculosis, or have recently been in close contact with someone who has tuberculosis  an unusual or allergic reaction to ixekizumab, other medicines, foods, dyes or preservatives  pregnant or trying to get pregnant  breast-feeding How should I use this medicine? This medicine is for injection under the skin. It may be administered by a healthcare professional in a hospital or clinic setting or at home. If you get this medicine at home, you will be taught how to prepare and give this medicine. Use exactly as directed. Take your medicine at regular intervals. Do not take your medicine more often than directed. It is important that you put your used needles and syringes in a special sharps container. Do not put them in a trash can. If you do not have a sharps container, call your pharmacist or healthcare provider to get one. A special MedGuide will be given to you by the pharmacist with each prescription and refill. Be sure to read this information carefully each time. Talk to your pediatrician regarding the use of this medicine in children. While this drug may be prescribed for children as young as 6 years for selected conditions, precautions do apply. Overdosage: If you think you have taken too much of this medicine contact  a poison control center or emergency room at once. NOTE: This medicine is only for you. Do not share this medicine with others. What if I miss a dose? It is important not to miss your dose. Call your doctor of health care professional if you are unable to keep an appointment. If you give yourself the medicine and you miss a dose, take it as soon as you can. Then be sure to take your next doses on your regular schedule. Do not take double or extra doses. If you have questions about a missed injection, call your health care professional. What may interact with this medicine? Do not take this medicine with any of the following medications:  live virus vaccines This medicine may also interact with the following medications:  cyclosporine  inactivated vaccines  warfarin This list may not describe all possible interactions. Give your health care provider a list of all the medicines, herbs, non-prescription drugs, or dietary supplements you use. Also tell them if you smoke, drink alcohol, or use illegal drugs. Some items may interact with your medicine. What should I watch for while using this medicine? Tell your doctor or healthcare professional if your symptoms do not start to get better or if they get worse. You will be tested for tuberculosis (TB) before you start this medicine. If your doctor prescribes any medicine for TB, you should start taking the TB medicine before starting this medicine. Make sure to finish the full course of TB medicine. Call your doctor or healthcare professional for advice if you get a fever, chills or sore throat, or other symptoms of a  cold or flu. Do not treat yourself. This drug decreases your body's ability to fight infections. Try to avoid being around people who are sick. This medicine can decrease the response to a vaccine. If you need to get vaccinated, tell your healthcare professional if you have received this medicine within the last 6 months. Extra booster  doses may be needed. Talk to your doctor to see if a different vaccination schedule is needed. What side effects may I notice from receiving this medicine? Side effects that you should report to your doctor or health care professional as soon as possible:  allergic reactions like skin rash, itching or hives, swelling of the face, lips, or tongue  signs and symptoms of infection like fever or chills; cough; sore throat; pain or trouble passing urine  signs and symptoms of bowel problems like abdominal pain, diarrhea, blood in the stool, and weight loss  white patches in the mouth or throat  vaginal discharge, itching, or odor in women Side effects that usually do not require medical attention (report to your doctor or health care professional if they continue or are bothersome):  nausea  runny nose  sinus trouble This list may not describe all possible side effects. Call your doctor for medical advice about side effects. You may report side effects to FDA at 1-800-FDA-1088. Where should I keep my medicine? Keep out of the reach of children. Store the prefilled syringe or injection pen in a refrigerator between 2 to 8 degrees C (36 to 46 degrees F). Keep the syringe or the pen in the original carton until ready for use. Protect from light. Do not freeze. Do not shake. Prior to use, remove the syringe or pen from the refrigerator and use within 30 minutes. Throw away any unused medicine after the expiration date on the label. NOTE: This sheet is a summary. It may not cover all possible information. If you have questions about this medicine, talk to your doctor, pharmacist, or health care provider.  2020 Elsevier/Gold Standard (2018-12-30 19:35:08)   COVID-19 vaccine recommendations:   COVID-19 vaccine is recommended for everyone (unless you are allergic to a vaccine component), even if you are on a medication that suppresses your immune system.   If you are on Methotrexate, Cellcept  (mycophenolate), Rinvoq, Mitchell Morris, and Olumiant- hold the medication for 1 week after each vaccine. Hold Methotrexate for 2 weeks after the single dose COVID-19 vaccine.   If you are on Orencia subcutaneous injection - hold medication one week prior to and one week after the first COVID-19 vaccine dose (only).   If you are on Orencia IV infusions- time vaccination administration so that the first COVID-19 vaccination will occur four weeks after the infusion and postpone the subsequent infusion by one week.   If you are on Cyclophosphamide or Rituxan infusions please contact your doctor prior to receiving the COVID-19 vaccine.   Do not take Tylenol or any anti-inflammatory medications (NSAIDs) 24 hours prior to the COVID-19 vaccination.   There is no direct evidence about the efficacy of the COVID-19 vaccine in individuals who are on medications that suppress the immune system.   Even if you are fully vaccinated, and you are on any medications that suppress your immune system, please continue to wear a mask, maintain at least six feet social distance and practice hand hygiene.   If you develop a COVID-19 infection, please contact your PCP or our office to determine if you need monoclonal antibody infusion.  The booster vaccine  is now available for immunocompromised patients.   Please see the following web sites for updated information.   [email or web address]    Standing Labs We placed an order today for your standing lab work.   Please have your standing labs drawn in 1 month then every 3 months   If possible, please have your labs drawn 2 weeks prior to your appointment so that the provider can discuss your results at your appointment.  We have open lab daily Monday through Thursday from 8:30-12:30 PM and 1:30-4:30 PM and Friday from 8:30-12:30 PM and 1:30-4:00 PM at the office of Dr. Bo Merino, [facility].   Please be advised, patients with office appointments requiring lab work will take precedents over walk-in lab work.  If possible, please come for your lab work on Monday and Friday afternoons, as you may experience shorter wait times. The office is located at [address] [place], [place], [place] [address] No appointment is necessary.   Labs are drawn by [facility]. Please bring your co-pay at the time of your lab draw.  You may receive a bill from [facility] for your lab work.  If you wish to have your labs drawn at another location, please call the office 24 hours in advance to send orders.  If you have any questions regarding directions or hours of operation,  please call [phone].   As a reminder, please drink plenty of water prior to coming for your lab work. Thanks!

## 2020-05-27 LAB — QUANTIFERON-TB GOLD PLUS
Mitogen-NIL: >10 IU/mL
NIL: 0.03 IU/mL
QuantiFERON-TB Gold Plus: NEGATIVE
TB1-NIL: 0.01 IU/mL
TB2-NIL: 0.02 IU/mL

## 2020-05-27 NOTE — Progress Notes (Signed)
TB gold negative

## 2020-05-27 NOTE — Telephone Encounter (Signed)
Received a fax regarding Prior Authorization from [place] for [place]. Authorization has been DENIED because medication is not a covered benefit.Letter also states that we are unable to appeal.  Phone# [phone]  [employer] [facility] together form to patient to sign and faxed provider form to office for signature. Will follow up.

## 2020-06-01 NOTE — Telephone Encounter (Signed)
Spoke to patient, he states he faxed in his [place] patient consent to office. Will keep any eye out and will fax once received with denial letter.

## 2020-06-01 NOTE — Telephone Encounter (Signed)
Faxed forms to [employer] program to get patient enrolled into Taltz $25 program. Awaiting response.   Phone# [phone]

## 2020-06-03 ENCOUNTER — Other Ambulatory Visit: Payer: Self-pay | Admitting: Rheumatology

## 2020-06-03 NOTE — Telephone Encounter (Signed)
Last Visit: 05/25/2020 Next Visit: 07/07/2020 Labs: 04/21/2020 Glucose 135, Anion gap 4  Current Dose per office note 05/25/2020: Rasuvo 20 mg sq injections every 7 days  DX: Psoriatic arthritis  Okay to refill per Dr. Estanislado Pandy

## 2020-06-03 NOTE — Telephone Encounter (Signed)
Received fax from [place] together program that patient will be enrolled into their $25 program, due to not having Taltz coverage under his insurance plan. They will reach out to patient to discuss program and triage prescription to pharmacy for dispensing.  Patient can be scheduled for Taltz New start.

## 2020-06-03 NOTE — Telephone Encounter (Signed)
Attempted to contact the patient and left message for patient to call the office.  

## 2020-06-07 ENCOUNTER — Telehealth: Payer: Self-pay

## 2020-06-07 NOTE — Telephone Encounter (Signed)
Patient scheduled for a Taltz new start on 06/08/2020 at 8:40 am.

## 2020-06-07 NOTE — Telephone Encounter (Signed)
Noted. Reached out to patient today and left message for patient to call the office.

## 2020-06-07 NOTE — Telephone Encounter (Signed)
Tonee from [employer] called to let the office know that she has been trying to get in contact with the patient to enroll him into the program.  If you have any questions, please call [phone]

## 2020-06-07 NOTE — Telephone Encounter (Signed)
Attempted to contact the patient and left message for patient to call the office.  

## 2020-06-08 ENCOUNTER — Ambulatory Visit: Payer: Commercial Managed Care - PPO | Admitting: Physician Assistant

## 2020-06-08 ENCOUNTER — Encounter: Payer: Self-pay | Admitting: Physician Assistant

## 2020-06-08 ENCOUNTER — Other Ambulatory Visit: Payer: Self-pay

## 2020-06-08 VITALS — BP 160/90 | HR 71

## 2020-06-08 DIAGNOSIS — L4050 Arthropathic psoriasis, unspecified: Secondary | ICD-10-CM

## 2020-06-08 DIAGNOSIS — L409 Psoriasis, unspecified: Secondary | ICD-10-CM

## 2020-06-08 DIAGNOSIS — Z79899 Other long term (current) drug therapy: Secondary | ICD-10-CM

## 2020-06-08 NOTE — Patient Instructions (Signed)
Standing Labs We placed an order today for your standing lab work.   Please have your standing labs drawn in 1 month then every 3 months   If possible, please have your labs drawn 2 weeks prior to your appointment so that the provider can discuss your results at your appointment.  We have open lab daily Monday through Thursday from 8:30-12:30 PM and 1:30-4:30 PM and Friday from 8:30-12:30 PM and 1:30-4:00 PM at the office of Dr. Bo Merino, [facility].   Please be advised, patients with office appointments requiring lab work will take precedents over walk-in lab work.  If possible, please come for your lab work on Monday and Friday afternoons, as you may experience shorter wait times. The office is located at [address], [place], [place], [place] [address] No appointment is necessary.   Labs are drawn by [facility]. Please bring your co-pay at the time of your lab draw.  You may receive a bill from [facility] for your lab work.  If you wish to have your labs drawn at another location, please call the office 24 hours in advance to send orders.  If you have any questions regarding directions or hours of operation,  please call [phone].   As a reminder, please drink plenty of water prior to coming for your lab work. Thanks!  Ixekizumab injection What is this medicine? IXEKIZUMAB (ix e KIZ ue mab) is used to treat plaque psoriasis, psoriatic arthritis, ankylosing spondylitis, and active non-radiographic axial spondyloarthritis. This medicine may be used for other purposes; ask your health care provider or pharmacist if you have questions. COMMON BRAND NAME(S): TALTZ What should I tell my health care provider before I take this medicine? They need to know if you have any of these conditions:  immune system problems  infection (especially a viral infection such as chickenpox, cold sores, or herpes)  recently received or are scheduled to receive a  vaccine  tuberculosis, a positive skin test for tuberculosis, or have recently been in close contact with someone who has tuberculosis  an unusual or allergic reaction to ixekizumab, other medicines, foods, dyes or preservatives  pregnant or trying to get pregnant  breast-feeding How should I use this medicine? This medicine is for injection under the skin. It may be administered by a healthcare professional in a hospital or clinic setting or at home. If you get this medicine at home, you will be taught how to prepare and give this medicine. Use exactly as directed. Take your medicine at regular intervals. Do not take your medicine more often than directed. It is important that you put your used needles and syringes in a special sharps container. Do not put them in a trash can. If you do not have a sharps container, call your pharmacist or healthcare provider to get one. A special MedGuide will be given to you by the pharmacist with each prescription and refill. Be sure to read this information carefully each time. Talk to your pediatrician regarding the use of this medicine in children. While this drug may be prescribed for children as young as 6 years for selected conditions, precautions do apply. Overdosage: If you think you have taken too much of this medicine contact a poison control center or emergency room at once. NOTE: This medicine is only for you. Do not share this medicine with others. What if I miss a dose? It is important not to miss your dose. Call your doctor of health care professional if you  are unable to keep an appointment. If you give yourself the medicine and you miss a dose, take it as soon as you can. Then be sure to take your next doses on your regular schedule. Do not take double or extra doses. If you have questions about a missed injection, call your health care professional. What may interact with this medicine? Do not take this medicine with any of the following  medications:  live virus vaccines This medicine may also interact with the following medications:  cyclosporine  inactivated vaccines  warfarin This list may not describe all possible interactions. Give your health care provider a list of all the medicines, herbs, non-prescription drugs, or dietary supplements you use. Also tell them if you smoke, drink alcohol, or use illegal drugs. Some items may interact with your medicine. What should I watch for while using this medicine? Tell your doctor or healthcare professional if your symptoms do not start to get better or if they get worse. You will be tested for tuberculosis (TB) before you start this medicine. If your doctor prescribes any medicine for TB, you should start taking the TB medicine before starting this medicine. Make sure to finish the full course of TB medicine. Call your doctor or healthcare professional for advice if you get a fever, chills or sore throat, or other symptoms of a cold or flu. Do not treat yourself. This drug decreases your body's ability to fight infections. Try to avoid being around people who are sick. This medicine can decrease the response to a vaccine. If you need to get vaccinated, tell your healthcare professional if you have received this medicine within the last 6 months. Extra booster doses may be needed. Talk to your doctor to see if a different vaccination schedule is needed. What side effects may I notice from receiving this medicine? Side effects that you should report to your doctor or health care professional as soon as possible:  allergic reactions like skin rash, itching or hives, swelling of the face, lips, or tongue  signs and symptoms of infection like fever or chills; cough; sore throat; pain or trouble passing urine  signs and symptoms of bowel problems like abdominal pain, diarrhea, blood in the stool, and weight loss  white patches in the mouth or throat  vaginal discharge, itching, or  odor in women Side effects that usually do not require medical attention (report to your doctor or health care professional if they continue or are bothersome):  nausea  runny nose  sinus trouble This list may not describe all possible side effects. Call your doctor for medical advice about side effects. You may report side effects to FDA at 1-800-FDA-1088. Where should I keep my medicine? Keep out of the reach of children. Store the prefilled syringe or injection pen in a refrigerator between 2 to 8 degrees C (36 to 46 degrees F). Keep the syringe or the pen in the original carton until ready for use. Protect from light. Do not freeze. Do not shake. Prior to use, remove the syringe or pen from the refrigerator and use within 30 minutes. Throw away any unused medicine after the expiration date on the label. NOTE: This sheet is a summary. It may not cover all possible information. If you have questions about this medicine, talk to your doctor, pharmacist, or health care provider.  2020 Elsevier/Gold Standard (2018-12-30 19:35:08)

## 2020-06-08 NOTE — Progress Notes (Signed)
Pharmacy Note  Subjective:   Patient presents to clinic today to receive the first dose of Taltz.   Starter dose: 160 mg (2 80 mg pens) at week 0 then maintenance dose of 80 mg every 4 weeks. Dosing regimen reviewed today in the office. He was given a handout of information about taltz and all questions were addressed.   Patient running a fever or have signs/symptoms of infection? No  Patient currently on antibiotics for the treatment of infection? No  Patient have any upcoming invasive procedures/surgeries? No   Patient has not received the COVID-19 vaccines.  He is aware that he needs to hold taltz if he develops signs or symptoms of an infection and to resume once the infection has completely cleared.   Objective: CMP     Component Value Date/Time   NA 138 04/21/2020 0949   K 4.0 04/21/2020 0949   CL 105 04/21/2020 0949   CO2 29 04/21/2020 0949   GLUCOSE 135 (H) 04/21/2020 0949   BUN 17 04/21/2020 0949   CREATININE 0.85 04/21/2020 0949   CREATININE 0.89 01/14/2020 0937   CALCIUM 9.3 04/21/2020 0949   PROT 7.8 04/21/2020 0949   ALBUMIN 4.1 04/21/2020 0949   AST 22 04/21/2020 0949   ALT 36 04/21/2020 0949   ALKPHOS 74 04/21/2020 0949   BILITOT 0.7 04/21/2020 0949   GFRNONAA >60 04/21/2020 0949   GFRNONAA 98 01/14/2020 0937   GFRAA >60 04/21/2020 0949   GFRAA 113 01/14/2020 0937    CBC    Component Value Date/Time   WBC 4.8 04/21/2020 0949   WBC 4.2 01/14/2020 0937   RBC 4.88 04/21/2020 0949   HGB 14.7 04/21/2020 0949   HCT 43.4 04/21/2020 0949   PLT 237 04/21/2020 0949   MCV 88.9 04/21/2020 0949   MCH 30.1 04/21/2020 0949   MCHC 33.9 04/21/2020 0949   RDW 13.2 04/21/2020 0949   LYMPHSABS 1.3 04/21/2020 0949   MONOABS 0.3 04/21/2020 0949   EOSABS 0.0 04/21/2020 0949   BASOSABS 0.0 04/21/2020 0949    Baseline Immunosuppressant Therapy Labs TB GOLD Quantiferon TB Gold Latest Ref Rng & Units 05/25/2020  Quantiferon TB Gold Plus NEGATIVE NEGATIVE    Hepatitis Panel   HIV Lab Results  Component Value Date   HIV NON-REACTIVE 03/17/2019   Immunoglobulins Immunoglobulin Electrophoresis Latest Ref Rng & Units 04/21/2020  IgA  47 - 310 mg/dL -  IgG 603 - 1,613 mg/dL 1,142  IgM 20 - 172 mg/dL 165   SPEP Serum Protein Electrophoresis Latest Ref Rng & Units 04/21/2020  Total Protein 6.5 - 8.1 g/dL 7.8  Albumin 3.8 - 4.8 g/dL -  Alpha-1 0.2 - 0.3 g/dL -  Alpha-2 0.5 - 0.9 g/dL -  Beta Globulin 0.4 - 0.6 g/dL -  Beta 2 0.2 - 0.5 g/dL -  Gamma Globulin 0.8 - 1.7 g/dL -   G6PD No results found for: G6PDH TPMT No results found for: TPMT   Chest x-ray: CXR 05/30/19   Assessment/Plan:  Demonstrated proper injection technique with Taltz demo device  Patient able to demonstrate proper injection technique using the teach back method.  Patient self injected in the left and right lower abdomen with:  Sample Medication: Taltz single dose prefilled autoinjector  NDC: 7673-4193-79 Lot: [id number] CD Expiration: June 2023   Patient tolerated well.  Observed for 30 mins in office for adverse reaction.  No systemic or local reaction noted.   We discussed icing before and after the injection to help with the  burning sensation.  He was advised to notify us if he develops an injection site reaction.    Patient is to return in 1 month for labs and 6-8 weeks for follow-up appointment.  Standing orders placed.   Taltz approved through insurance.   All questions encouraged and answered.  Instructed patient to call with any further questions or concerns.  Hazel Sams, PA-C  06/08/2020 11:19 AM

## 2020-06-27 NOTE — Progress Notes (Signed)
Office Visit Note  Patient: Mitchell Morris             Date of Birth: 22-Mar-1966           MRN: [id number]             PCP: Kathyrn Lass, MD Referring: Kathyrn Lass, MD Visit Date: 07/07/2020 Occupation: @GUAROCC @  Subjective:  Medication monitoring.   History of Present Illness: Mitchell Morris is a 54 y.o. male with history of psoriatic arthritis psoriasis and osteoarthritis.  He states that after starting Donnetta Hail a month later all symptoms resolved.  He is doing well without any joint pain or joint swelling.  He has no psoriasis lesions.  He has some discomfort in his lower back due to underlying disc disease.  Activities of Daily Living:  Patient reports morning stiffness for 0 minute.   Patient Denies nocturnal pain.  Difficulty dressing/grooming: Denies Difficulty climbing stairs: Denies Difficulty getting out of chair: Denies Difficulty using hands for taps, buttons, cutlery, and/or writing: Denies  Review of Systems  Constitutional: Negative for fatigue and night sweats.  HENT: Negative for mouth sores, mouth dryness and nose dryness.   Eyes: Negative for redness and dryness.  Respiratory: Negative for shortness of breath and difficulty breathing.   Cardiovascular: Negative for chest pain, palpitations, hypertension, irregular heartbeat and swelling in legs/feet.  Gastrointestinal: Negative for constipation and diarrhea.  Endocrine: Negative for increased urination.  Musculoskeletal: Negative for arthralgias, joint pain, joint swelling, myalgias, muscle weakness, morning stiffness, muscle tenderness and myalgias.  Skin: Negative for color change, rash, hair loss, nodules/bumps, skin tightness, ulcers and sensitivity to sunlight.  Allergic/Immunologic: Negative for susceptible to infections.  Neurological: Negative for dizziness, fainting, memory loss, night sweats and weakness ( ).  Hematological: Negative for swollen glands.  Psychiatric/Behavioral: Negative  for depressed mood and sleep disturbance. The patient is not nervous/anxious.     PMFS History:  Patient Active Problem List   Diagnosis Date Noted  . Goals of care, counseling/discussion 04/22/2019  . Elevated blood pressure reading 04/22/2019  . MGUS (monoclonal gammopathy of unknown significance) 03/26/2019  . Primary osteoarthritis of both hands 03/12/2017  . Primary osteoarthritis of both feet 03/12/2017  . DDD (degenerative disc disease), lumbar 03/12/2017  . Alcohol use/ keep Methotrexate dose low  03/12/2017  . Psoriatic arthritis ([place]) 09/27/2016  . Psoriasis 09/27/2016  . High risk medication use 09/27/2016    Past Medical History:  Diagnosis Date  . Psoriatic arthritis ([place])     Family History  Problem Relation Age of Onset  . Alzheimer's disease Mother   . Hypertension Father   . Cancer Brother        testicular   . Healthy Daughter   . Healthy Son    Past Surgical History:  Procedure Laterality Date  . COLONOSCOPY  08/2019  . KNEE ARTHROPLASTY     Social History   Social History Narrative  . Not on file    There is no immunization history on file for this patient.   Objective: Vital Signs: Resp 14   Ht 6' (1.829 m)   BMI 27.21 kg/m    Physical Exam Vitals and nursing note reviewed.  Constitutional:      Appearance: He is well-developed and well-nourished.  HENT:     Head: Normocephalic and atraumatic.  Eyes:     Extraocular Movements: EOM normal.     Conjunctiva/sclera: Conjunctivae normal.     Pupils: Pupils are equal, round, and reactive to  light.  Cardiovascular:     Rate and Rhythm: Normal rate and regular rhythm.     Heart sounds: Normal heart sounds.  Pulmonary:     Effort: Pulmonary effort is normal.     Breath sounds: Normal breath sounds.  Abdominal:     General: Bowel sounds are normal.     Palpations: Abdomen is soft.  Musculoskeletal:     Cervical back: Normal range of motion and neck supple.  Skin:    General: Skin is  warm and dry.     Capillary Refill: Capillary refill takes less than 2 seconds.  Neurological:     Mental Status: He is alert and oriented to person, place, and time.  Psychiatric:        Mood and Affect: Mood and affect normal.        Behavior: Behavior normal.      Musculoskeletal Exam: C-spine was in good range of motion.  He has some discomfort with the range of lumbar spine.  He states because of the hamstring tightness.  Shoulder joints, elbow joints, wrist joints, MCPs PIPs and DIPs with good range of motion.  He has bilateral PIP and DIP thickening.  Hip joints, knee joints, ankles, MTPs and PIPs with good range of motion with no synovitis.  CDAI Exam: CDAI Score: 0  Patient Global: 0 mm; Provider Global: 0 mm Swollen: 0 ; Tender: 0  Joint Exam 07/07/2020   No joint exam has been documented for this visit   There is currently no information documented on the homunculus. Go to the Rheumatology activity and complete the homunculus joint exam.  Investigation: No additional findings.  Imaging: No results found.  Recent Labs: Lab Results  Component Value Date   WBC 4.8 04/21/2020   HGB 14.7 04/21/2020   PLT 237 04/21/2020   NA 138 04/21/2020   K 4.0 04/21/2020   CL 105 04/21/2020   CO2 29 04/21/2020   GLUCOSE 135 (H) 04/21/2020   BUN 17 04/21/2020   CREATININE 0.85 04/21/2020   BILITOT 0.7 04/21/2020   ALKPHOS 74 04/21/2020   AST 22 04/21/2020   ALT 36 04/21/2020   PROT 7.8 04/21/2020   ALBUMIN 4.1 04/21/2020   CALCIUM 9.3 04/21/2020   GFRAA >60 04/21/2020   QFTBGOLDPLUS NEGATIVE 05/25/2020    Speciality Comments: Prior therapy: Stelara (inadequate response) and Humira (good response but issues with coverage)cosentyx- stopped working  Procedures:  No procedures performed Allergies: Patient has no known allergies.   Assessment / Plan:     Visit Diagnoses: Psoriatic arthritis ([place]) - Inadequate response to Cosentyx and stelara.  Discontinued Humira due to  issues with coverage.  He was started on Taltz June 08, 2020.  He states after a couple of doses he had great response and he continues to do well.  He denies any joint pain or joint swelling.  He has no morning stiffness.  He would like to reduce the risk of overdose.  I was in agreement.  We will decrease his dose of Rasuvo to 15 mg subcu weekly for the next refill.  The Rasuvo can be tapered depending on his response to the treatment.  Psoriasis-resolved.  High risk medication use - Taltz 80 mg/mL every 28 days, Rasuvo 20 mg sq injections every 7 days and folic acid 1 mg 2 tablets daily.  - Plan: CBC with Differential/Platelet, COMPLETE METABOLIC PANEL WITH GFR today and then every 3 months to monitor for drug toxicity.  TB gold was done on  May 25, 2020.  He does not want to get COVID-19 vaccination.  He understands the risk.  Primary osteoarthritis of both hands-he has some osteoarthritis in his hands with DIP and PIP thickening.  Primary osteoarthritis of both feet-currently not having much discomfort.  Alcohol use/ keep Methotrexate dose low   DDD (degenerative disc disease), lumbar-he continues to have some lower back pain and intermittent flares.  Have given a handout on back exercises.  Neck stiffness  Abnormal SPEP - He was recently evaluated by Dr. Julien Nordmann and had updated lab work on 04/21/2020.  He will have a follow-up appointment in 1 year.  Orders: Orders Placed This Encounter  Procedures  . CBC with Differential/Platelet  . COMPLETE METABOLIC PANEL WITH GFR   No orders of the defined types were placed in this encounter.     Follow-Up Instructions: Return in about 5 months (around 12/05/2020) for Psoriatic arthritis.   Bo Merino, MD  Note - This record has been created using Editor, commissioning.  Chart creation errors have been sought, but may not always  have been located. Such creation errors do not reflect on  the standard of medical care.

## 2020-07-07 ENCOUNTER — Other Ambulatory Visit: Payer: Self-pay

## 2020-07-07 ENCOUNTER — Encounter: Payer: Self-pay | Admitting: Rheumatology

## 2020-07-07 ENCOUNTER — Ambulatory Visit: Payer: Commercial Managed Care - PPO | Admitting: Rheumatology

## 2020-07-07 VITALS — BP 161/96 | HR 77 | Resp 14 | Ht 72.0 in | Wt 196.0 lb

## 2020-07-07 DIAGNOSIS — M436 Torticollis: Secondary | ICD-10-CM

## 2020-07-07 DIAGNOSIS — Z7289 Other problems related to lifestyle: Secondary | ICD-10-CM

## 2020-07-07 DIAGNOSIS — M19042 Primary osteoarthritis, left hand: Secondary | ICD-10-CM

## 2020-07-07 DIAGNOSIS — L4050 Arthropathic psoriasis, unspecified: Secondary | ICD-10-CM

## 2020-07-07 DIAGNOSIS — Z79899 Other long term (current) drug therapy: Secondary | ICD-10-CM

## 2020-07-07 DIAGNOSIS — M19072 Primary osteoarthritis, left ankle and foot: Secondary | ICD-10-CM

## 2020-07-07 DIAGNOSIS — L409 Psoriasis, unspecified: Secondary | ICD-10-CM | POA: Diagnosis not present

## 2020-07-07 DIAGNOSIS — Z789 Other specified health status: Secondary | ICD-10-CM

## 2020-07-07 DIAGNOSIS — R778 Other specified abnormalities of plasma proteins: Secondary | ICD-10-CM

## 2020-07-07 DIAGNOSIS — M19041 Primary osteoarthritis, right hand: Secondary | ICD-10-CM

## 2020-07-07 DIAGNOSIS — M19071 Primary osteoarthritis, right ankle and foot: Secondary | ICD-10-CM

## 2020-07-07 DIAGNOSIS — M5136 Other intervertebral disc degeneration, lumbar region: Secondary | ICD-10-CM

## 2020-07-07 MED ORDER — METHOTREXATE (PF) 15 MG/0.3ML ~~LOC~~ SOAJ: 15.0000 mg | SUBCUTANEOUS | 0 refills | Status: DC

## 2020-07-07 NOTE — Patient Instructions (Addendum)
Standing Labs We placed an order today for your standing lab work.   Please have your standing labs drawn in March and every 3 months  If possible, please have your labs drawn 2 weeks prior to your appointment so that the provider can discuss your results at your appointment.  We have open lab daily Monday through Thursday from 8:30-12:30 PM and 1:30-4:30 PM and Friday from 8:30-12:30 PM and 1:30-4:00 PM at the office of Dr. Bo Merino, [facility].   Please be advised, patients with office appointments requiring lab work will take precedents over walk-in lab work.  If possible, please come for your lab work on Monday and Friday afternoons, as you may experience shorter wait times. The office is located at [address] [place], [place], [place] [address] No appointment is necessary.   Labs are drawn by [facility]. Please bring your co-pay at the time of your lab draw.  You may receive a bill from [facility] for your lab work.  If you wish to have your labs drawn at another location, please call the office 24 hours in advance to send orders.  If you have any questions regarding directions or hours of operation,  please call [phone].   As a reminder, please drink plenty of water prior to coming for your lab work. Thanks!  COVID-19 vaccine recommendations:   COVID-19 vaccine is recommended for everyone (unless you are allergic to a vaccine component), even if you are on a medication that suppresses your immune system.   If you are on Methotrexate, Cellcept (mycophenolate), Rinvoq, Morrie Sheldon, and Olumiant- hold the medication for 1 week after each vaccine. Hold Methotrexate for 2 weeks after the single dose COVID-19 vaccine.   Do not take Tylenol or any anti-inflammatory medications (NSAIDs) 24 hours prior to the COVID-19 vaccination.   There is no direct evidence about the efficacy of the COVID-19 vaccine in individuals who are on medications that suppress the immune  system.   Even if you are fully vaccinated, and you are on any medications that suppress your immune system, please continue to wear a mask, maintain at least six feet social distance and practice hand hygiene.   If you develop a COVID-19 infection, please contact your PCP or our office to determine if you need monoclonal antibody infusion.  The booster vaccine is now available for immunocompromised patients.   Please see the following web sites for updated information.   [email or web address]   Heart Disease Prevention   Your inflammatory disease increases your risk of heart disease which includes heart attack, stroke, atrial fibrillation (irregular heartbeats), high blood pressure, heart failure and atherosclerosis (plaque in the arteries).  It is important to reduce your risk by:   . Keep blood pressure, cholesterol, and blood sugar at healthy levels   . Smoking Cessation   . Maintain a healthy weight  o BMI 20-25   . Eat a healthy diet  o Plenty of fresh fruit, vegetables, and whole grains  o Limit saturated fats, foods high in sodium, and added sugars  o DASH and Mediterranean diet   . Increase physical activity  o Recommend moderate physically activity for 150 minutes per week/ 30 minutes a day for five days a week These can be broken up into three separate ten-minute sessions during the day.   . Reduce Stress  . Meditation, slow breathing exercises, yoga, coloring books  . Dental visits twice a year    Back Exercises The following exercises strengthen the muscles  that help to support the trunk and back. They also help to keep the lower back flexible. Doing these exercises can help to prevent back pain or lessen existing pain.  If you have back pain or discomfort, try doing these exercises 2-3 times each day or as told by your health care provider.  As your pain improves, do them once each day, but  increase the number of times that you repeat the steps for each exercise (do more repetitions).  To prevent the recurrence of back pain, continue to do these exercises once each day or as told by your health care provider. Do exercises exactly as told by your health care provider and adjust them as directed. It is normal to feel mild stretching, pulling, tightness, or discomfort as you do these exercises, but you should stop right away if you feel sudden pain or your pain gets worse. Exercises Single knee to chest Repeat these steps 3-5 times for each leg: 1. Lie on your back on a firm bed or the floor with your legs extended. 2. Bring one knee to your chest. Your other leg should stay extended and in contact with the floor. 3. Hold your knee in place by grabbing your knee or thigh with both hands and hold. 4. Pull on your knee until you feel a gentle stretch in your lower back or buttocks. 5. Hold the stretch for 10-30 seconds. 6. Slowly release and straighten your leg. Pelvic tilt Repeat these steps 5-10 times: 1. Lie on your back on a firm bed or the floor with your legs extended. 2. Bend your knees so they are pointing toward the ceiling and your feet are flat on the floor. 3. Tighten your lower abdominal muscles to press your lower back against the floor. This motion will tilt your pelvis so your tailbone points up toward the ceiling instead of pointing to your feet or the floor. 4. With gentle tension and even breathing, hold this position for 5-10 seconds. Cat-cow Repeat these steps until your lower back becomes more flexible: 1. Get into a hands-and-knees position on a firm surface. Keep your hands under your shoulders, and keep your knees under your hips. You may place padding under your knees for comfort. 2. Let your head hang down toward your chest. Contract your abdominal muscles and point your tailbone toward the floor so your lower back becomes rounded like the back of a  cat. 3. Hold this position for 5 seconds. 4. Slowly lift your head, let your abdominal muscles relax and point your tailbone up toward the ceiling so your back forms a sagging arch like the back of a cow. 5. Hold this position for 5 seconds.  Press-ups Repeat these steps 5-10 times: 1. Lie on your abdomen (face-down) on the floor. 2. Place your palms near your head, about shoulder-width apart. 3. Keeping your back as relaxed as possible and keeping your hips on the floor, slowly straighten your arms to raise the top half of your body and lift your shoulders. Do not use your back muscles to raise your upper torso. You may adjust the placement of your hands to make yourself more comfortable. 4. Hold this position for 5 seconds while you keep your back relaxed. 5. Slowly return to lying flat on the floor.  Bridges Repeat these steps 10 times: 1. Lie on your back on a firm surface. 2. Bend your knees so they are pointing toward the ceiling and your feet are flat on the floor.  Your arms should be flat at your sides, next to your body. 3. Tighten your buttocks muscles and lift your buttocks off the floor until your waist is at almost the same height as your knees. You should feel the muscles working in your buttocks and the back of your thighs. If you do not feel these muscles, slide your feet 1-2 inches farther away from your buttocks. 4. Hold this position for 3-5 seconds. 5. Slowly lower your hips to the starting position, and allow your buttocks muscles to relax completely. If this exercise is too easy, try doing it with your arms crossed over your chest. Abdominal crunches Repeat these steps 5-10 times: 1. Lie on your back on a firm bed or the floor with your legs extended. 2. Bend your knees so they are pointing toward the ceiling and your feet are flat on the floor. 3. Cross your arms over your chest. 4. Tip your chin slightly toward your chest without bending your neck. 5. Tighten your  abdominal muscles and slowly raise your trunk (torso) high enough to lift your shoulder blades a tiny bit off the floor. Avoid raising your torso higher than that because it can put too much stress on your low back and does not help to strengthen your abdominal muscles. 6. Slowly return to your starting position. Back lifts Repeat these steps 5-10 times: 1. Lie on your abdomen (face-down) with your arms at your sides, and rest your forehead on the floor. 2. Tighten the muscles in your legs and your buttocks. 3. Slowly lift your chest off the floor while you keep your hips pressed to the floor. Keep the back of your head in line with the curve in your back. Your eyes should be looking at the floor. 4. Hold this position for 3-5 seconds. 5. Slowly return to your starting position. Contact a health care provider if:  Your back pain or discomfort gets much worse when you do an exercise.  Your worsening back pain or discomfort does not lessen within 2 hours after you exercise. If you have any of these problems, stop doing these exercises right away. Do not do them again unless your health care provider says that you can. Get help right away if:  You develop sudden, severe back pain. If this happens, stop doing the exercises right away. Do not do them again unless your health care provider says that you can. This information is not intended to replace advice given to you by your health care provider. Make sure you discuss any questions you have with your health care provider. Document Revised: 11/20/2018 Document Reviewed: 04/17/2018 Elsevier Patient Education  [place].

## 2020-07-08 LAB — COMPLETE METABOLIC PANEL WITH GFR
AG Ratio: 1.3 (calc) (ref 1.0–2.5)
ALT: 27 U/L (ref 9–46)
AST: 23 U/L (ref 10–35)
Albumin: 4.4 g/dL (ref 3.6–5.1)
Alkaline phosphatase (APISO): 71 U/L (ref 35–144)
BUN: 14 mg/dL (ref 7–25)
CO2: 26 mmol/L (ref 20–32)
Calcium: 9.6 mg/dL (ref 8.6–10.3)
Chloride: 103 mmol/L (ref 98–110)
Creat: 0.89 mg/dL (ref 0.70–1.33)
GFR, Est African American: 112 mL/min/{1.73_m2} (ref >=60)
GFR, Est Non African American: 97 mL/min/{1.73_m2} (ref >=60)
Globulin: 3.3 g/dL (calc) (ref 1.9–3.7)
Glucose, Bld: 111 mg/dL — ABNORMAL HIGH (ref 65–99)
Potassium: 4.5 mmol/L (ref 3.5–5.3)
Sodium: 139 mmol/L (ref 135–146)
Total Bilirubin: 0.6 mg/dL (ref 0.2–1.2)
Total Protein: 7.7 g/dL (ref 6.1–8.1)

## 2020-07-08 LAB — CBC WITH DIFFERENTIAL/PLATELET
Absolute Monocytes: 409 cells/uL (ref 200–950)
Basophils Absolute: 18 cells/uL (ref 0–200)
Basophils Relative: 0.4 %
Eosinophils Absolute: 28 cells/uL (ref 15–500)
Eosinophils Relative: 0.6 %
HCT: 43.3 % (ref 38.5–50.0)
Hemoglobin: 14.6 g/dL (ref 13.2–17.1)
Lymphs Abs: 1155 cells/uL (ref 850–3900)
MCH: 29.7 pg (ref 27.0–33.0)
MCHC: 33.7 g/dL (ref 32.0–36.0)
MCV: 88 fL (ref 80.0–100.0)
MPV: 10.2 fL (ref 7.5–12.5)
Monocytes Relative: 8.9 %
Neutro Abs: 2990 cells/uL (ref 1500–7800)
Neutrophils Relative %: 65 %
Platelets: 323 10*3/uL (ref 140–400)
RBC: 4.92 10*6/uL (ref 4.20–5.80)
RDW: 12.9 % (ref 11.0–15.0)
Total Lymphocyte: 25.1 %
WBC: 4.6 10*3/uL (ref 3.8–10.8)

## 2020-07-12 ENCOUNTER — Telehealth: Payer: Self-pay

## 2020-07-12 NOTE — Telephone Encounter (Signed)
Noted. Thanks.

## 2020-07-12 NOTE — Telephone Encounter (Signed)
Mitchell Morris from Mitchell Morris called to let the office know that patient received his shipment of medication on 07/01/20.  Mitchell Morris states patient will remain on the $25 program until next year November, 2022.

## 2020-08-25 ENCOUNTER — Telehealth: Payer: Self-pay

## 2020-08-25 NOTE — Telephone Encounter (Signed)
Submitted a Prior Authorization request to [employer] for TALTZ via Cover My Meds. Will update once we receive a response.   (Key: [id number])  We will see if insurance formulary has any changes in 2022 and if we can appeal now.

## 2020-08-25 NOTE — Telephone Encounter (Signed)
Received response from [employer] that patient's plan ended on 07/29/20.  Submitted a Prior Authorization request to [facility] for [place] via Cover My Meds. Will update once we receive a response.   Key: [id number]

## 2020-08-25 NOTE — Telephone Encounter (Signed)
Tonee Girdis from [employer] called stating they just received notification on 08/18/20 that an appeal can be submitted to patient's insurance for 2022.  Tonee states she recommends patient continue with their $25 program for 3-4 months to see how patient tolerates medication.  If you have any questions, please call back at [phone]

## 2020-08-26 NOTE — Telephone Encounter (Signed)
Called [facility] for patient's copay card. They will need to do a new benefits investigation for patient's new insurance to update his copay card to the $5 copay plan.  Faxed approval letter to [employer].  Should be complete by Monday.

## 2020-08-26 NOTE — Telephone Encounter (Signed)
Received notification from [facility] regarding a prior authorization for [place]. Authorization has been APPROVED from 08/25/20 to 08/24/21.   Authorization # [id number]  Ran test claim, patient can now fill through [facility] with [place] card

## 2020-08-29 NOTE — Telephone Encounter (Signed)
ATC patient regarding Donnetta Hail being filled at [facility]. Left VM with [facility] phone number.  Knox Saliva, PharmD, MPH Clinical Pharmacist (Rheumatology and Pulmonology)

## 2020-08-29 NOTE — Telephone Encounter (Addendum)
Taltz copay card information: Active from 06/02/20 through 06/03/23  ID: [id number] BIN: [id number] Grp: [id number] PCN: [facility] has sent rx to [facility]

## 2020-09-14 ENCOUNTER — Other Ambulatory Visit: Payer: Self-pay

## 2020-09-14 MED ORDER — METHOTREXATE (PF) 15 MG/0.3ML ~~LOC~~ SOAJ: 15.0000 mg | SUBCUTANEOUS | 0 refills | Status: DC

## 2020-09-14 NOTE — Telephone Encounter (Signed)
Last Visit: 07/07/2020 Next Visit: 12/05/2020 Labs: 07/07/2020, Glucose is 111. Rest of CMP WNL. CBC WNL.  Current Dose per office note 07/07/2020, Rasuvo to 15 mg subcu weekly  DX: Psoriatic arthritis   Last Fill: 07/07/2020  Okay to refill Rasuvo?

## 2020-09-14 NOTE — Telephone Encounter (Signed)
Patient called to check the status of his prescription  refill of Rasuvo and if it was sent to [facility].

## 2020-09-16 ENCOUNTER — Telehealth: Payer: Self-pay

## 2020-09-16 MED ORDER — METHOTREXATE (PF) 15 MG/0.3ML ~~LOC~~ SOAJ: 15.0000 mg | SUBCUTANEOUS | 0 refills | Status: DC

## 2020-09-16 NOTE — Telephone Encounter (Signed)
Patient advised his prescription has been resent to the speciality pharmacy [facility]. Patient states he will need a refill on Taltz next month as he is receiving his last shipment on Tuesday. Patient advised he will need to update labs next month and we will be able to send new prescription. Patient expressed understanding.

## 2020-09-16 NOTE — Telephone Encounter (Signed)
Patient called to check the status of his prescription of Rasuvo.  Patient was told the prescription was sent to [facility] in [place] on 09/14/20.  Patient states he has never received his injections from his local pharmacy and states they should have been sent to his specialty pharmacy.  Patient states he is out of injections and requested a return call ASAP.

## 2020-09-22 ENCOUNTER — Telehealth: Payer: Self-pay

## 2020-09-22 ENCOUNTER — Telehealth: Payer: Self-pay | Admitting: Pharmacy Technician

## 2020-09-22 DIAGNOSIS — L4050 Arthropathic psoriasis, unspecified: Secondary | ICD-10-CM

## 2020-09-22 DIAGNOSIS — L409 Psoriasis, unspecified: Secondary | ICD-10-CM

## 2020-09-22 MED ORDER — OTREXUP 15 MG/0.4ML ~~LOC~~ SOAJ: 15.0000 mg | SUBCUTANEOUS | 0 refills | Status: DC

## 2020-09-22 MED ORDER — RASUVO 15 MG/0.3ML ~~LOC~~ SOAJ: 15.0000 mg | SUBCUTANEOUS | 0 refills | Status: DC

## 2020-09-22 NOTE — Addendum Note (Signed)
Addended by: Cassandria Anger on: 09/22/2020 03:57 PM   Modules accepted: Orders

## 2020-09-22 NOTE — Telephone Encounter (Signed)
We did not send a prescription for Taltz, however [facility] has rx on file that was tranferred from [facility] - 1 fill (1 pen remaining).  is due for labs this month, and plan was to send new Taltz rx after updated labs resulted.  Provided verbal for Rasuvo #12 pens, 0 refills since rx was sent incorrectly as vial. Ordered Rasuvo as no-print  Knox Saliva, PharmD, MPH Clinical Pharmacist (Rheumatology and Pulmonology)

## 2020-09-22 NOTE — Telephone Encounter (Signed)
Latoya from [facility] left a voicemail stating they received a prescription for the patient written by Hazel Sams.  The pharmacist needs further clarification on the patient's Taltz prescription.  Please return our call at  [phone] ext [phone]

## 2020-09-22 NOTE — Telephone Encounter (Addendum)
Received PA request for Rasuvo 15mg , plan prefers Otrexup.  Received notification from [facility] regarding a prior authorization for OTREXUP. Authorization has been APPROVED from 09/22/20 to 09/21/21.   Authorization # Key: [id number]  Advised Mitchell Morris Rph, she will send in rx for Otrexup.  [place]- [id number] PCN- CN ID- [id number] Grp- [id number]

## 2020-09-22 NOTE — Telephone Encounter (Signed)
Otrexup prescription sent to [facility] for 12 pens, 0 refills. Noted that rx replaces Rasuvo and included copay card information.  Knox Saliva, PharmD, MPH Clinical Pharmacist (Rheumatology and Pulmonology)

## 2020-09-23 ENCOUNTER — Telehealth: Payer: Self-pay

## 2020-09-23 NOTE — Telephone Encounter (Signed)
Patient advised Otrexup has been sent to the pharmacy as that is what his insurance prefers. Patient advised there was a note to the pharmacy on prescription that the [place] replaces the Rasuvo. Patient will follow up with pharmacy this afternoon.

## 2020-09-23 NOTE — Telephone Encounter (Signed)
Patient states he called [facility] to schedule delivery of his medication and was told they were sent two prescriptions of the same medication with two different names (Otrexup/Rasuvo).  Patient states [facility] doesn't know which medication to fill and is waiting for a call from the office.

## 2020-09-26 ENCOUNTER — Telehealth: Payer: Self-pay

## 2020-09-26 DIAGNOSIS — L4050 Arthropathic psoriasis, unspecified: Secondary | ICD-10-CM

## 2020-09-26 DIAGNOSIS — L409 Psoriasis, unspecified: Secondary | ICD-10-CM

## 2020-09-26 MED ORDER — OTREXUP 15 MG/0.4ML ~~LOC~~ SOAJ: 15.0000 mg | SUBCUTANEOUS | 0 refills | Status: DC

## 2020-09-26 NOTE — Telephone Encounter (Signed)
Patient called stating he received an automated message from [facility] that [facility] prefers prescriptions be filled through [facility].  Patient states the message didn't state which medication they were referring to.  Patient states "he is not sure what is going on and requested to speak with the pharmacist ASAP."

## 2020-09-26 NOTE — Telephone Encounter (Signed)
Rx for Otrexup re-sent to [facility] (since it is not a specialty medication for his insurance plan). Patient provided with pharmacy phone number and advised him to call pharmacy tomorrow to expedite shipment. Recommended that he notify pharmacy that he is overdue for rx and they should be able to schedule shipment next week.  Patient will continue filling Taltz at [facility] per pharmacy rep.  Patient states he has been out of Otrexup for 2 weeks. Advised that he can call the clinic for samples however we have had sample supply shortage as well.  Doesn't report any flare or pain but states that insurance change has caused delay. Apologized to patient and he was understanding of the situation.  Patient verbalized understanding. Will f/u to ensure [facility] fills and ships his rx  Knox Saliva, PharmD, MPH Clinical Pharmacist (Rheumatology and Pulmonology)

## 2020-09-28 MED ORDER — RASUVO 15 MG/0.3ML ~~LOC~~ SOAJ: 15.0000 mg | SUBCUTANEOUS | 0 refills | Status: DC

## 2020-09-28 NOTE — Telephone Encounter (Signed)
Submitted a Prior Authorization request to [employer] for [place] via fax. Will update once we receive a response.  Patient has new insurance that became effective on 09/27/20.  ID: [id number] Group: [id number]  Fax: [phone] Phone: [phone]  Knox Saliva, PharmD, MPH Clinical Pharmacist (Rheumatology and Pulmonology)

## 2020-09-28 NOTE — Telephone Encounter (Signed)
New [employer] plan that's effective 09/27/20 prefers Rasuvo again. Mitchell Morris will submit prior auth for Rasuvo 15mg  via CMM

## 2020-09-28 NOTE — Telephone Encounter (Signed)
Rx for Rasuvo sent to [facility] with copay card information. To replace Otrexup that was preferred by prior plan.  Rasuvo copay card Coralyn Mark, rep at [facility], former dispensing specialty pharmacy, did not know when card was activated): BIN:  [id number] PCN:  PYS ID:  [id number] Grp:  [id number]  Spoke with patient regarding Rasuvo and that rx will be sent to [facility]. Patient verbalized understanding.  Knox Saliva, PharmD, MPH Clinical Pharmacist (Rheumatology and Pulmonology)

## 2020-09-28 NOTE — Telephone Encounter (Signed)
Received notification from [facility] regarding a prior authorization for RASUVO. Authorization has been APPROVED from 09/28/20 to 09/28/21.   Authorization # [id number]- Case ID- [id number]  Please send rx for Rasuvo to [facility].

## 2020-09-29 ENCOUNTER — Telehealth: Payer: Self-pay

## 2020-09-29 NOTE — Telephone Encounter (Signed)
Alvester Chou from [facility] requested a return call to confirm  which medication to fill - Otrexup or Rasuvo.  Please call back at [phone]  Order #[id number]

## 2020-09-29 NOTE — Telephone Encounter (Signed)
Returned call and advised of patient's insurance change. Newest plan prefers Rasuvo. They will move forward with Rasuvo rx. Nothing further is needed.

## 2020-09-30 NOTE — Telephone Encounter (Signed)
[facility] able to process Rasuvo prescription.  Patient's copay is $5 for 84 day supply after using copay card. Nothing further needed. Will close encounter.

## 2020-10-03 ENCOUNTER — Other Ambulatory Visit: Payer: Self-pay | Admitting: Physician Assistant

## 2020-10-03 NOTE — Telephone Encounter (Signed)
Last Visit: 12/05/2020 Next Visit: 07/07/2020 Labs: 07/07/2020, Glucose is 111. Rest of CMP WNL. CBC WNL. Patient will have labs drawn 10/07/2020 TB Gold: 05/25/2020, negative  Current Dose per office note 07/07/2020,Taltz 80 mg/mL every 28 days. He was started on Taltz June 08, 2020  DX: Psoriatic arthritis   Last Fill:   Okay to refill Vaughan Basta?

## 2020-10-03 NOTE — Telephone Encounter (Signed)
Please advise patient that labs are due this month.

## 2020-11-03 ENCOUNTER — Telehealth: Payer: Self-pay

## 2020-11-03 NOTE — Telephone Encounter (Signed)
PA is pending. Awaiting response.

## 2020-11-03 NOTE — Telephone Encounter (Signed)
Danielle from [employer] called checking the status of the prior authorization for patient's Taltz.  Andee Poles states she sent a fax on 11/01/20 and hasn't received a response.  Key#[id number]    If you have any questions, please call back at [phone] ext [phone]

## 2020-11-07 ENCOUNTER — Telehealth: Payer: Self-pay

## 2020-11-07 ENCOUNTER — Other Ambulatory Visit: Payer: Self-pay | Admitting: *Deleted

## 2020-11-07 MED ORDER — TALTZ 80 MG/ML ~~LOC~~ SOAJ
SUBCUTANEOUS | 2 refills | Status: DC
Start: 2020-11-07 — End: 2021-10-23

## 2020-11-07 NOTE — Telephone Encounter (Signed)
Received notification from [facility] regarding a prior authorization for [place]. Authorization has been APPROVED from 11/01/20 to 11/04/21.   Authorization # [id number]   Called [facility] to advise.

## 2020-11-07 NOTE — Telephone Encounter (Signed)
Danielle from [facility] called stating the Taltz prescription can't exceed a 30 day supply.  The prescription that was sent in was rejected due to being written for 84 days.

## 2020-11-07 NOTE — Telephone Encounter (Signed)
Returned call to Andee Poles, left message to advise to fill 1 pen for 28 days.

## 2020-11-21 NOTE — Progress Notes (Addendum)
Office Visit Note  Patient: Mitchell Morris             Date of Birth: 1966-05-12           MRN: [id number]             PCP: Kathyrn Lass, MD Referring: Kathyrn Lass, MD Visit Date: 12/05/2020 Occupation: @GUAROCC @  Subjective:  Pain in both hands   History of Present Illness: MICKEY ESGUERRA is a 55 y.o. male with history of psoriatic arthritis and osteoarthritis.  Patient is currently on Taltz 80 mg subcutaneous injections once monthly, Rasuvo 15 mg sq injections once weekly, and folic acid 2 mg by mouth daily.  Patient reports that he has had several interruptions in taking Taltz due to changes with his insurance and difficulty receiving shipments from the specialty pharmacy.  He states his most recent injection was 1 week ago.  He continues to experience intermittent pain and stiffness in both hands.  His pain is most severe in the right third PIP joint.  He states that he has started to be able to play golf on a regular basis again and has been working throughout the day without difficulty.  Overall he feels as though his symptoms are more tolerable since switching from Cosentyx to [employer].  He states that the Taltz injection is significantly more painful and he has been dreading it on a monthly basis.  He states that his most recent injection he had a small area of redness which is itchy for about 24 hours. He denies any recent infections.   Activities of Daily Living:  Patient reports joint stiffness all day  Patient Reports nocturnal pain.  Difficulty dressing/grooming: Denies Difficulty climbing stairs: Denies Difficulty getting out of chair: Denies Difficulty using hands for taps, buttons, cutlery, and/or writing: Reports  Review of Systems  Constitutional: Positive for fatigue.  HENT: Negative for mouth dryness.   Eyes: Negative for dryness.  Respiratory: Negative for shortness of breath.   Cardiovascular: Negative for swelling in legs/feet.  Gastrointestinal:  Negative for constipation.  Endocrine: Negative for cold intolerance.  Genitourinary: Negative for difficulty urinating.  Musculoskeletal: Positive for arthralgias, joint pain, joint swelling, muscle weakness and morning stiffness.  Skin: Negative for rash.  Allergic/Immunologic: Negative for susceptible to infections.  Neurological: Negative for weakness.  Hematological: Negative for bruising/bleeding tendency.  Psychiatric/Behavioral: Negative for sleep disturbance.    PMFS History:  Patient Active Problem List   Diagnosis Date Noted  . Goals of care, counseling/discussion 04/22/2019  . Elevated blood pressure reading 04/22/2019  . MGUS (monoclonal gammopathy of unknown significance) 03/26/2019  . Primary osteoarthritis of both hands 03/12/2017  . Primary osteoarthritis of both feet 03/12/2017  . DDD (degenerative disc disease), lumbar 03/12/2017  . Alcohol use/ keep Methotrexate dose low  03/12/2017  . Psoriatic arthritis ([place]) 09/27/2016  . Psoriasis 09/27/2016  . High risk medication use 09/27/2016    Past Medical History:  Diagnosis Date  . Psoriatic arthritis ([place])     Family History  Problem Relation Age of Onset  . Alzheimer's disease Mother   . Hypertension Father   . Cancer Brother        testicular   . Healthy Daughter   . Healthy Son    Past Surgical History:  Procedure Laterality Date  . COLONOSCOPY  08/2019  . KNEE ARTHROPLASTY     Social History   Social History Narrative  . Not on file    There is no immunization history  on file for this patient.   Objective: Vital Signs: BP (!) 154/88 (BP Location: Left Arm, Patient Position: Sitting, Cuff Size: Normal)   Pulse 76   Resp 15   Ht 6' (1.829 m)   Wt 202 lb (91.6 kg)   BMI 27.40 kg/m    Physical Exam Vitals and nursing note reviewed.  Constitutional:      Appearance: He is well-developed.  HENT:     Head: Normocephalic and atraumatic.  Eyes:     Conjunctiva/sclera: Conjunctivae normal.      Pupils: Pupils are equal, round, and reactive to light.  Pulmonary:     Effort: Pulmonary effort is normal.  Abdominal:     Palpations: Abdomen is soft.  Musculoskeletal:     Cervical back: Normal range of motion and neck supple.  Skin:    General: Skin is warm and dry.     Capillary Refill: Capillary refill takes less than 2 seconds.  Neurological:     Mental Status: He is alert and oriented to person, place, and time.  Psychiatric:        Behavior: Behavior normal.      Musculoskeletal Exam: C-spine limited ROM with lateral rotation.  Thoracic and lumbar spine good ROM.  No SI joint tenderness.  Shoulder joints, elbow joints, wrist joints, MCPs, PIPs, and DIPs good ROM. Complete fist formation bilaterally.  Tenderness and synovitis of the right 3rd PIP joint.  PIP and DIP thickening consistent with osteoarthritis of both hands. Hip joints good ROM with no discomfort.  Knee joints good ROM with no warmth or effusion.  Ankle joints good ROM with no tenderness or joint swelling.  No evidence of achilles tendonitis or plantar fasciitis.  No tenderness of MTP joints.    CDAI Exam: CDAI Score: -- Patient Global: --; Provider Global: -- Swollen: --; Tender: -- Joint Exam 12/05/2020   No joint exam has been documented for this visit   There is currently no information documented on the homunculus. Go to the Rheumatology activity and complete the homunculus joint exam.  Investigation: No additional findings.  Imaging: No results found.  Recent Labs: Lab Results  Component Value Date   WBC 4.6 07/07/2020   HGB 14.6 07/07/2020   PLT 323 07/07/2020   NA 139 07/07/2020   K 4.5 07/07/2020   CL 103 07/07/2020   CO2 26 07/07/2020   GLUCOSE 111 (H) 07/07/2020   BUN 14 07/07/2020   CREATININE 0.89 07/07/2020   BILITOT 0.6 07/07/2020   ALKPHOS 74 04/21/2020   AST 23 07/07/2020   ALT 27 07/07/2020   PROT 7.7 07/07/2020   ALBUMIN 4.1 04/21/2020   CALCIUM 9.6 07/07/2020    GFRAA 112 07/07/2020   QFTBGOLDPLUS NEGATIVE 05/25/2020    Speciality Comments: Prior therapy: Stelara (inadequate response) and Humira (good response but issues with coverage)cosentyx- stopped working, Taltz-start date June 08, 2020  Procedures:  No procedures performed Allergies: Patient has no known allergies.   Assessment / Plan:     Visit Diagnoses: Psoriatic arthritis ([place]) - Inadequate response to Cosentyx and stelara.  Discontinued Humira due to issues with coverage.  He was started on Taltz June 08, 2020: He has tenderness and synovitis over the right third PIP joint on examination today.  He was able to make a complete fist bilaterally.  No signs of dactylitis were noted.  He has no evidence of Achilles tendinitis or plantar fasciitis.  No SI joint discomfort or stiffness at this time.  He is currently  on Taltz 80 mg subcutaneous injections every 28 days, Rasuvo 15 mg subcutaneous injections every week, and folic acid 2 mg by mouth daily.  He has had several delays in taking his Taltz injection due to changes in insurance and coordinating prescription delivery with the specialty pharmacy.  His most recent injection was last week at which time he developed redness at the injection site and has pain with each injection which is severe for about 10 seconds.  We discussed trying to ice the site before and after the injection. Injection technique was also reviewed with the patient today.  He can apply hydrocortisone cream topically if he develops an injection site reaction in the future.  Overall Donnetta Hail seems to be more effective than Cosentyx was in the past.  He declined scheduling an ultrasound-guided right third PIP joint injection.  He will remain on the current treatment regimen.  He was advised to notify us if his joint pain and stiffness persist or worsen.  He will follow-up in the office in 3 months to reassess his response to combination therapy.  Psoriasis: He has no active  psoriasis at this time.  High risk medication use - Taltz 80 mg/mL sq injections every 28 days, Rasuvo 15 mg sq injections every 7 days, and folic acid 1 mg 2 tablets daily.  Previously had inadequate response to Cosentyx and Stelara.  Discontinued Humira due to issues with insurance.  Started on Taltz 06/08/2020.  CBC and CMP were drawn on 07/07/2020.  He is overdue to update lab work.  Orders for CBC and CMP were released.  His next lab work will be due in August and every 3 months to monitor for drug toxicity.  Standing orders for CBC and CMP remain in place.  TB gold negative on 05/25/2020.- Plan: CBC with Differential/Platelet, COMPLETE METABOLIC PANEL WITH GFR He has not had any recent infections. He has not received the COVID-19 vaccines and does not plan to. He is aware that he is to hold Taltz and Rasuvo if he develops signs or symptoms of an infection and to resume once the infection has completely cleared.  Primary osteoarthritis of both hands: He has PIP and DIP thickening consistent with osteoarthritis of both hands.  He has tenderness and synovitis over the right third PIP joint.  He is able to make a complete fist bilaterally.  We discussed the importance of joint protection and muscle strengthening.  He declined an ultrasound-guided right third PIP joint injection at this time.  He was advised to notify us if his discomfort persists or worsens.  Primary osteoarthritis of both feet: He experiences intermittent discomfort in both feet.  No evidence of Achilles tendinitis or plantar fasciitis.  He has good range of motion of both ankle joints with no tenderness or inflammation.  Discussed the importance of wearing proper fitting shoes.  Alcohol use/ keep Methotrexate dose low   DDD (degenerative disc disease), lumbar: He experiences intermittent pain and stiffness in his lower back.  He is not experiencing any symptoms of radiculopathy at this time.  He has no midline spinal  tenderness.  Neck stiffness: He has limited range of motion of the C-spine with lateral rotation bilaterally.  No symptoms of radiculopathy.  He has been trying to perform neck exercises on a regular basis.  Abnormal SPEP - Evaluated by Dr. Julien Nordmann.  He has an upcoming appointment scheduled on 04/27/2021.  Orders: Orders Placed This Encounter  Procedures  . CBC with Differential/Platelet  . COMPLETE METABOLIC  PANEL WITH GFR   No orders of the defined types were placed in this encounter.   Follow-Up Instructions: Return in 5 months (on 05/07/2021) for Psoriatic arthritis.   Ofilia Neas, PA-C  Note - This record has been created using Dragon software.  Chart creation errors have been sought, but may not always  have been located. Such creation errors do not reflect on  the standard of medical care.

## 2020-12-05 ENCOUNTER — Encounter: Payer: Self-pay | Admitting: Physician Assistant

## 2020-12-05 ENCOUNTER — Other Ambulatory Visit: Payer: Self-pay

## 2020-12-05 ENCOUNTER — Ambulatory Visit: Payer: Managed Care, Other (non HMO) | Admitting: Physician Assistant

## 2020-12-05 VITALS — BP 154/88 | HR 76 | Resp 15 | Ht 72.0 in | Wt 202.0 lb

## 2020-12-05 DIAGNOSIS — M19041 Primary osteoarthritis, right hand: Secondary | ICD-10-CM

## 2020-12-05 DIAGNOSIS — M5136 Other intervertebral disc degeneration, lumbar region: Secondary | ICD-10-CM

## 2020-12-05 DIAGNOSIS — Z79899 Other long term (current) drug therapy: Secondary | ICD-10-CM | POA: Diagnosis not present

## 2020-12-05 DIAGNOSIS — M19071 Primary osteoarthritis, right ankle and foot: Secondary | ICD-10-CM

## 2020-12-05 DIAGNOSIS — L4050 Arthropathic psoriasis, unspecified: Secondary | ICD-10-CM | POA: Diagnosis not present

## 2020-12-05 DIAGNOSIS — L409 Psoriasis, unspecified: Secondary | ICD-10-CM | POA: Diagnosis not present

## 2020-12-05 DIAGNOSIS — Z7289 Other problems related to lifestyle: Secondary | ICD-10-CM

## 2020-12-05 DIAGNOSIS — M436 Torticollis: Secondary | ICD-10-CM

## 2020-12-05 DIAGNOSIS — M19042 Primary osteoarthritis, left hand: Secondary | ICD-10-CM

## 2020-12-05 DIAGNOSIS — M19072 Primary osteoarthritis, left ankle and foot: Secondary | ICD-10-CM

## 2020-12-05 DIAGNOSIS — Z789 Other specified health status: Secondary | ICD-10-CM

## 2020-12-05 DIAGNOSIS — R778 Other specified abnormalities of plasma proteins: Secondary | ICD-10-CM

## 2020-12-05 NOTE — Patient Instructions (Signed)
Standing Labs We placed an order today for your standing lab work.   Please have your standing labs drawn in August and every 3 days   If possible, please have your labs drawn 2 weeks prior to your appointment so that the provider can discuss your results at your appointment.  We have open lab daily Monday through Thursday from 1:30-4:30 PM and Friday from 1:30-4:00 PM at the office of Dr. Bo Merino, [facility].   Please be advised, all patients with office appointments requiring lab work will take precedents over walk-in lab work.  If possible, please come for your lab work on Monday and Friday afternoons, as you may experience shorter wait times. The office is located at [address] [place], [place],  [address] No appointment is necessary.   Labs are drawn by [facility]. Please bring your co-pay at the time of your lab draw.  You may receive a bill from [facility] for your lab work.  If you wish to have your labs drawn at another location, please call the office 24 hours in advance to send orders.  If you have any questions regarding directions or hours of operation,  please call [phone].   As a reminder, please drink plenty of water prior to coming for your lab work. Thanks!

## 2020-12-06 LAB — COMPLETE METABOLIC PANEL WITH GFR
AG Ratio: 1.4 (calc) (ref 1.0–2.5)
ALT: 20 U/L (ref 9–46)
AST: 18 U/L (ref 10–35)
Albumin: 4.4 g/dL (ref 3.6–5.1)
Alkaline phosphatase (APISO): 64 U/L (ref 35–144)
BUN: 15 mg/dL (ref 7–25)
CO2: 30 mmol/L (ref 20–32)
Calcium: 9.6 mg/dL (ref 8.6–10.3)
Chloride: 102 mmol/L (ref 98–110)
Creat: 0.97 mg/dL (ref 0.70–1.33)
GFR, Est African American: 102 mL/min/{1.73_m2} (ref >=60)
GFR, Est Non African American: 88 mL/min/{1.73_m2} (ref >=60)
Globulin: 3.1 g/dL (calc) (ref 1.9–3.7)
Glucose, Bld: 92 mg/dL (ref 65–99)
Potassium: 4.8 mmol/L (ref 3.5–5.3)
Sodium: 139 mmol/L (ref 135–146)
Total Bilirubin: 0.7 mg/dL (ref 0.2–1.2)
Total Protein: 7.5 g/dL (ref 6.1–8.1)

## 2020-12-06 LAB — CBC WITH DIFFERENTIAL/PLATELET
Absolute Monocytes: 546 cells/uL (ref 200–950)
Basophils Absolute: 22 cells/uL (ref 0–200)
Basophils Relative: 0.5 %
Eosinophils Absolute: 39 cells/uL (ref 15–500)
Eosinophils Relative: 0.9 %
HCT: 44.8 % (ref 38.5–50.0)
Hemoglobin: 14.8 g/dL (ref 13.2–17.1)
Lymphs Abs: 1466 cells/uL (ref 850–3900)
MCH: 28.8 pg (ref 27.0–33.0)
MCHC: 33 g/dL (ref 32.0–36.0)
MCV: 87.3 fL (ref 80.0–100.0)
MPV: 10.1 fL (ref 7.5–12.5)
Monocytes Relative: 12.7 %
Neutro Abs: 2227 cells/uL (ref 1500–7800)
Neutrophils Relative %: 51.8 %
Platelets: 267 10*3/uL (ref 140–400)
RBC: 5.13 10*6/uL (ref 4.20–5.80)
RDW: 12.8 % (ref 11.0–15.0)
Total Lymphocyte: 34.1 %
WBC: 4.3 10*3/uL (ref 3.8–10.8)

## 2020-12-06 NOTE — Progress Notes (Signed)
CBC and CMP WNL

## 2020-12-13 ENCOUNTER — Other Ambulatory Visit: Payer: Self-pay | Admitting: Physician Assistant

## 2020-12-13 DIAGNOSIS — L4050 Arthropathic psoriasis, unspecified: Secondary | ICD-10-CM

## 2020-12-13 DIAGNOSIS — L409 Psoriasis, unspecified: Secondary | ICD-10-CM

## 2020-12-13 NOTE — Telephone Encounter (Signed)
Next Visit: 03/14/2021  Last Visit: 12/05/2020  Last Fill: 09/28/2020  DX: Psoriatic arthritis   Current Dose per office note 12/05/2020, Rasuvo 15 mg sq injections every 7 days  Labs: 12/05/2020, CBC and CMP WNL  Okay to refill Rasuvo?

## 2021-02-28 NOTE — Progress Notes (Deleted)
Office Visit Note  Patient: Mitchell Morris             Date of Birth: July 26, 1966           MRN: [id number]             PCP: Kathyrn Lass, MD Referring: Kathyrn Lass, MD Visit Date: 03/14/2021 Occupation: '@GUAROCC'$ @  Subjective:  No chief complaint on file.   History of Present Illness: Mitchell Morris is a 55 y.o. male ***   Activities of Daily Living:  Patient reports morning stiffness for *** {minute/hour:19697}.   Patient {ACTIONS;DENIES/REPORTS:21021675::"Denies"} nocturnal pain.  Difficulty dressing/grooming: {ACTIONS;DENIES/REPORTS:21021675::"Denies"} Difficulty climbing stairs: {ACTIONS;DENIES/REPORTS:21021675::"Denies"} Difficulty getting out of chair: {ACTIONS;DENIES/REPORTS:21021675::"Denies"} Difficulty using hands for taps, buttons, cutlery, and/or writing: {ACTIONS;DENIES/REPORTS:21021675::"Denies"}  No Rheumatology ROS completed.   PMFS History:  Patient Active Problem List   Diagnosis Date Noted   Goals of care, counseling/discussion 04/22/2019   Elevated blood pressure reading 04/22/2019   MGUS (monoclonal gammopathy of unknown significance) 03/26/2019   Primary osteoarthritis of both hands 03/12/2017   Primary osteoarthritis of both feet 03/12/2017   DDD (degenerative disc disease), lumbar 03/12/2017   Alcohol use/ keep Methotrexate dose low  03/12/2017   Psoriatic arthritis ([place]) 09/27/2016   Psoriasis 09/27/2016   High risk medication use 09/27/2016    Past Medical History:  Diagnosis Date   Psoriatic arthritis ([place] [place])     Family History  Problem Relation Age of Onset   Alzheimer's disease Mother    Hypertension Father    Cancer Brother        testicular    Healthy Daughter    Healthy Son    Past Surgical History:  Procedure Laterality Date   COLONOSCOPY  08/2019   KNEE ARTHROPLASTY     Social History   Social History Narrative   Not on file    There is no immunization history on file for this patient.   Objective: Vital  Signs: There were no vitals taken for this visit.   Physical Exam   Musculoskeletal Exam: ***  CDAI Exam: CDAI Score: -- Patient Global: --; Provider Global: -- Swollen: --; Tender: -- Joint Exam 03/14/2021   No joint exam has been documented for this visit   There is currently no information documented on the homunculus. Go to the Rheumatology activity and complete the homunculus joint exam.  Investigation: No additional findings.  Imaging: No results found.  Recent Labs: Lab Results  Component Value Date   WBC 4.3 12/05/2020   HGB 14.8 12/05/2020   PLT 267 12/05/2020   NA 139 12/05/2020   K 4.8 12/05/2020   CL 102 12/05/2020   CO2 30 12/05/2020   GLUCOSE 92 12/05/2020   BUN 15 12/05/2020   CREATININE 0.97 12/05/2020   BILITOT 0.7 12/05/2020   ALKPHOS 74 04/21/2020   AST 18 12/05/2020   ALT 20 12/05/2020   PROT 7.5 12/05/2020   ALBUMIN 4.1 04/21/2020   CALCIUM 9.6 12/05/2020   GFRAA 102 12/05/2020   QFTBGOLDPLUS NEGATIVE 05/25/2020    Speciality Comments: Prior therapy: Stelara (inadequate response) and Humira (good response but issues with coverage)cosentyx- stopped working, Taltz-start date June 08, 2020  Procedures:  No procedures performed Allergies: Patient has no known allergies.   Assessment / Plan:     Visit Diagnoses: No diagnosis found.  Orders: No orders of the defined types were placed in this encounter.  No orders of the defined types were placed in this encounter.   Face-to-face time spent with  patient was *** minutes. Greater than 50% of time was spent in counseling and coordination of care.  Follow-Up Instructions: No follow-ups on file.   Earnestine Mealing, CMA  Note - This record has been created using Editor, commissioning.  Chart creation errors have been sought, but may not always  have been located. Such creation errors do not reflect on  the standard of medical care.

## 2021-03-14 ENCOUNTER — Ambulatory Visit: Payer: Managed Care, Other (non HMO) | Admitting: Rheumatology

## 2021-03-14 DIAGNOSIS — Z7289 Other problems related to lifestyle: Secondary | ICD-10-CM

## 2021-03-14 DIAGNOSIS — Z79899 Other long term (current) drug therapy: Secondary | ICD-10-CM

## 2021-03-14 DIAGNOSIS — L409 Psoriasis, unspecified: Secondary | ICD-10-CM

## 2021-03-14 DIAGNOSIS — M19041 Primary osteoarthritis, right hand: Secondary | ICD-10-CM

## 2021-03-14 DIAGNOSIS — R778 Other specified abnormalities of plasma proteins: Secondary | ICD-10-CM

## 2021-03-14 DIAGNOSIS — M5136 Other intervertebral disc degeneration, lumbar region: Secondary | ICD-10-CM

## 2021-03-14 DIAGNOSIS — M19071 Primary osteoarthritis, right ankle and foot: Secondary | ICD-10-CM

## 2021-03-14 DIAGNOSIS — M436 Torticollis: Secondary | ICD-10-CM

## 2021-03-14 DIAGNOSIS — L4050 Arthropathic psoriasis, unspecified: Secondary | ICD-10-CM

## 2021-04-20 ENCOUNTER — Other Ambulatory Visit: Payer: Self-pay

## 2021-04-20 ENCOUNTER — Inpatient Hospital Stay: Payer: Managed Care, Other (non HMO) | Attending: Internal Medicine

## 2021-04-20 DIAGNOSIS — D472 Monoclonal gammopathy: Secondary | ICD-10-CM | POA: Insufficient documentation

## 2021-04-20 DIAGNOSIS — L4050 Arthropathic psoriasis, unspecified: Secondary | ICD-10-CM | POA: Diagnosis not present

## 2021-04-20 LAB — CMP (CANCER CENTER ONLY)
ALT: 37 U/L (ref 0–44)
AST: 25 U/L (ref 15–41)
Albumin: 4.1 g/dL (ref 3.5–5.0)
Alkaline Phosphatase: 79 U/L (ref 38–126)
Anion gap: 9 (ref 5–15)
BUN: 16 mg/dL (ref 6–20)
CO2: 26 mmol/L (ref 22–32)
Calcium: 9.9 mg/dL (ref 8.9–10.3)
Chloride: 104 mmol/L (ref 98–111)
Creatinine: 0.95 mg/dL (ref 0.61–1.24)
GFR, Estimated: >60 mL/min (ref >60)
Glucose, Bld: 105 mg/dL — ABNORMAL HIGH (ref 70–99)
Potassium: 4.4 mmol/L (ref 3.5–5.1)
Sodium: 139 mmol/L (ref 135–145)
Total Bilirubin: 0.7 mg/dL (ref 0.3–1.2)
Total Protein: 8.2 g/dL — ABNORMAL HIGH (ref 6.5–8.1)

## 2021-04-20 LAB — CBC WITH DIFFERENTIAL (CANCER CENTER ONLY)
Abs Immature Granulocytes: 0.02 10*3/uL (ref 0.00–0.07)
Basophils Absolute: 0 10*3/uL (ref 0.0–0.1)
Basophils Relative: 1 %
Eosinophils Absolute: 0 10*3/uL (ref 0.0–0.5)
Eosinophils Relative: 1 %
HCT: 43.7 % (ref 39.0–52.0)
Hemoglobin: 14.5 g/dL (ref 13.0–17.0)
Immature Granulocytes: 0 %
Lymphocytes Relative: 21 %
Lymphs Abs: 1.4 10*3/uL (ref 0.7–4.0)
MCH: 28.7 pg (ref 26.0–34.0)
MCHC: 33.2 g/dL (ref 30.0–36.0)
MCV: 86.4 fL (ref 80.0–100.0)
Monocytes Absolute: 0.4 10*3/uL (ref 0.1–1.0)
Monocytes Relative: 7 %
Neutro Abs: 4.5 10*3/uL (ref 1.7–7.7)
Neutrophils Relative %: 70 %
Platelet Count: 262 10*3/uL (ref 150–400)
RBC: 5.06 MIL/uL (ref 4.22–5.81)
RDW: 12.4 % (ref 11.5–15.5)
WBC Count: 6.3 10*3/uL (ref 4.0–10.5)
nRBC: 0 % (ref 0.0–0.2)

## 2021-04-20 LAB — LACTATE DEHYDROGENASE: LDH: 167 U/L (ref 98–192)

## 2021-04-21 LAB — IGG, IGA, IGM
IgA: 563 mg/dL — ABNORMAL HIGH (ref 90–386)
IgG (Immunoglobin G), Serum: 1203 mg/dL (ref 603–1613)
IgM (Immunoglobulin M), Srm: 182 mg/dL — ABNORMAL HIGH (ref 20–172)

## 2021-04-21 LAB — KAPPA/LAMBDA LIGHT CHAINS
Kappa free light chain: 17.5 mg/L (ref 3.3–19.4)
Kappa, lambda light chain ratio: 0.04 — ABNORMAL LOW (ref 0.26–1.65)
Lambda free light chains: 463.4 mg/L — ABNORMAL HIGH (ref 5.7–26.3)

## 2021-04-21 LAB — BETA 2 MICROGLOBULIN, SERUM: Beta-2 Microglobulin: 1.6 mg/L (ref 0.6–2.4)

## 2021-04-27 ENCOUNTER — Other Ambulatory Visit: Payer: Self-pay

## 2021-04-27 ENCOUNTER — Inpatient Hospital Stay: Payer: Managed Care, Other (non HMO) | Admitting: Internal Medicine

## 2021-04-27 VITALS — BP 160/102 | HR 73 | Temp 98.2°F | Resp 19 | Ht 72.0 in | Wt 205.6 lb

## 2021-04-27 DIAGNOSIS — D472 Monoclonal gammopathy: Secondary | ICD-10-CM

## 2021-04-27 NOTE — Progress Notes (Signed)
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    PRIOR THERAPY: None   CURRENT THERAPY: Observation   INTERVAL HISTORY: Mitchell Morris 55 y.o. male returns to the clinic today for follow-up visit.  The patient is feeling fine today with no concerning complaints except for the worsening arthritis.  He has not been taking his medication for the psoriatic arthritis for a while.  He is switching a rheumatologist and expected to see Dr. Amil Amen next week.  The patient denied having any chest pain, shortness of breath, cough or hemoptysis.  He denied having any fever or chills.  He has no nausea, vomiting, diarrhea or constipation.  He had repeat myeloma panel performed recently and he is here for evaluation and discussion of his lab results.    MEDICAL HISTORY: Past Medical History:  Diagnosis Date   Psoriatic arthritis ([place])     ALLERGIES:  has No Known Allergies.  MEDICATIONS:  Current Outpatient Medications  Medication Sig Dispense Refill   folic acid (FOLVITE) 1 MG tablet Take 1 tablet (1 mg total) by mouth daily. 120 tablet 2   Ixekizumab (TALTZ) 80 MG/ML SOAJ ADMINISTER 80 MG (1 INJECTION) UNDER THE SKIN EVERY 4 WEEKS THEREAFTER (MAINTENANCE DOSE) 1 mL 2   Multiple Vitamin (MULTI-VITAMIN) tablet Take 1 tablet by mouth daily.     RASUVO 15 MG/0.3ML SOAJ INJECT 15 MG UNDER THE SKIN EVERY 7 DAYS 3.6 mL 0   VITAMIN D PO Take by mouth daily.     No current facility-administered medications for this visit.    SURGICAL HISTORY:  Past Surgical History:  Procedure Laterality Date   COLONOSCOPY  08/2019   KNEE ARTHROPLASTY      REVIEW OF SYSTEMS:  A comprehensive review of systems was negative except for: Musculoskeletal: positive for  arthralgias and stiff joints   PHYSICAL EXAMINATION: General appearance: alert, cooperative, and no distress Head: Normocephalic, without obvious abnormality, atraumatic Neck: no adenopathy, no JVD, supple, symmetrical, trachea midline, and thyroid not enlarged, symmetric, no tenderness/mass/nodules Lymph nodes: Cervical, supraclavicular, and axillary nodes normal. Resp: clear to auscultation bilaterally Back: symmetric, no curvature. ROM normal. No CVA tenderness. Cardio: regular rate and rhythm, S1, S2 normal, no murmur, click, rub or gallop GI: soft, non-tender; bowel sounds normal; no masses,  no organomegaly Extremities: extremities normal, atraumatic, no cyanosis or edema  ECOG PERFORMANCE STATUS: 1 - Symptomatic but completely ambulatory  Blood pressure (!) 160/102, pulse 73, temperature 98.2 F (36.8 C), temperature source Oral, resp. rate 19, height 6' (1.829 m), weight 205 lb 9.6 oz (93.3 kg), SpO2 98 %.  LABORATORY DATA: Lab Results  Component Value Date   WBC 6.3 04/20/2021   HGB 14.5 04/20/2021   HCT 43.7 04/20/2021   MCV 86.4 04/20/2021   PLT 262 04/20/2021      Chemistry      Component Value Date/Time   NA 139 04/20/2021 0856   K 4.4 04/20/2021 0856   CL 104 04/20/2021 0856   CO2 26 04/20/2021 0856   BUN 16 04/20/2021 0856   CREATININE 0.95 04/20/2021 0856   CREATININE 0.97 12/05/2020 0842      Component Value Date/Time   CALCIUM 9.9 04/20/2021 0856   ALKPHOS 79 04/20/2021 0856   AST 25 04/20/2021 0856  ALT 37 04/20/2021 0856   BILITOT 0.7 04/20/2021 0856       RADIOGRAPHIC STUDIES: No results found.  ASSESSMENT AND PLAN: This is a very pleasant 55 years old white male with history of monoclonal gammopathy of undetermined significance and currently on observation. The patient is doing fine today with no concerning complaints except for the worsening arthritis and he is expected to see a new rheumatologist next week Dr. Amil Amen. He had repeat  myeloma panel that showed further increase in the free lambda light chain but this could be a result of the worsening inflammatory process and no recent treatment. He has no other signs to confirm multiple myeloma.  He has no anemia, renal insufficiency, hypercalcemia or bone disease. I recommended for him to continue on observation with repeat myeloma panel in 6 months. The patient was advised to call immediately if he has any other concerning symptoms in the interval.  The patient voices understanding of current disease status and treatment options and is in agreement with the current care plan.  All questions were answered. The patient knows to call the clinic with any problems, questions or concerns. We can certainly see the patient much sooner if necessary.   Disclaimer: This note was dictated with voice recognition software. Similar sounding words can inadvertently be transcribed and may not be corrected upon review.

## 2021-04-28 ENCOUNTER — Telehealth: Payer: Self-pay | Admitting: Internal Medicine

## 2021-04-28 NOTE — Telephone Encounter (Signed)
Scheduled appt per 9/29 los - mailed letter with appt date and time   

## 2021-10-16 ENCOUNTER — Inpatient Hospital Stay: Payer: Managed Care, Other (non HMO) | Attending: Internal Medicine

## 2021-10-16 ENCOUNTER — Other Ambulatory Visit: Payer: Self-pay

## 2021-10-16 DIAGNOSIS — D472 Monoclonal gammopathy: Secondary | ICD-10-CM | POA: Insufficient documentation

## 2021-10-16 DIAGNOSIS — M1990 Unspecified osteoarthritis, unspecified site: Secondary | ICD-10-CM | POA: Insufficient documentation

## 2021-10-16 DIAGNOSIS — Z79899 Other long term (current) drug therapy: Secondary | ICD-10-CM | POA: Insufficient documentation

## 2021-10-16 LAB — CBC WITH DIFFERENTIAL (CANCER CENTER ONLY)
Abs Immature Granulocytes: 0.03 10*3/uL (ref 0.00–0.07)
Basophils Absolute: 0 10*3/uL (ref 0.0–0.1)
Basophils Relative: 0 %
Eosinophils Absolute: 0 10*3/uL (ref 0.0–0.5)
Eosinophils Relative: 1 %
HCT: 42.6 % (ref 39.0–52.0)
Hemoglobin: 14.7 g/dL (ref 13.0–17.0)
Immature Granulocytes: 0 %
Lymphocytes Relative: 21 %
Lymphs Abs: 1.5 10*3/uL (ref 0.7–4.0)
MCH: 29.4 pg (ref 26.0–34.0)
MCHC: 34.5 g/dL (ref 30.0–36.0)
MCV: 85.2 fL (ref 80.0–100.0)
Monocytes Absolute: 0.4 10*3/uL (ref 0.1–1.0)
Monocytes Relative: 6 %
Neutro Abs: 4.9 10*3/uL (ref 1.7–7.7)
Neutrophils Relative %: 72 %
Platelet Count: 339 10*3/uL (ref 150–400)
RBC: 5 MIL/uL (ref 4.22–5.81)
RDW: 13 % (ref 11.5–15.5)
WBC Count: 6.8 10*3/uL (ref 4.0–10.5)
nRBC: 0 % (ref 0.0–0.2)

## 2021-10-16 LAB — CMP (CANCER CENTER ONLY)
ALT: 50 U/L — ABNORMAL HIGH (ref 0–44)
AST: 24 U/L (ref 15–41)
Albumin: 4.5 g/dL (ref 3.5–5.0)
Alkaline Phosphatase: 68 U/L (ref 38–126)
Anion gap: 9 (ref 5–15)
BUN: 15 mg/dL (ref 6–20)
CO2: 25 mmol/L (ref 22–32)
Calcium: 9.8 mg/dL (ref 8.9–10.3)
Chloride: 105 mmol/L (ref 98–111)
Creatinine: 0.83 mg/dL (ref 0.61–1.24)
GFR, Estimated: >60 mL/min (ref >60)
Glucose, Bld: 117 mg/dL — ABNORMAL HIGH (ref 70–99)
Potassium: 4.1 mmol/L (ref 3.5–5.1)
Sodium: 139 mmol/L (ref 135–145)
Total Bilirubin: 0.5 mg/dL (ref 0.3–1.2)
Total Protein: 8.4 g/dL — ABNORMAL HIGH (ref 6.5–8.1)

## 2021-10-16 LAB — LACTATE DEHYDROGENASE: LDH: 149 U/L (ref 98–192)

## 2021-10-17 LAB — IGG, IGA, IGM
IgA: 573 mg/dL — ABNORMAL HIGH (ref 90–386)
IgG (Immunoglobin G), Serum: 1269 mg/dL (ref 603–1613)
IgM (Immunoglobulin M), Srm: 156 mg/dL (ref 20–172)

## 2021-10-17 LAB — KAPPA/LAMBDA LIGHT CHAINS
Kappa free light chain: 18.9 mg/L (ref 3.3–19.4)
Kappa, lambda light chain ratio: 0.04 — ABNORMAL LOW (ref 0.26–1.65)
Lambda free light chains: 424.5 mg/L — ABNORMAL HIGH (ref 5.7–26.3)

## 2021-10-17 LAB — BETA 2 MICROGLOBULIN, SERUM: Beta-2 Microglobulin: 1.6 mg/L (ref 0.6–2.4)

## 2021-10-23 ENCOUNTER — Inpatient Hospital Stay: Payer: Managed Care, Other (non HMO) | Admitting: Internal Medicine

## 2021-10-23 ENCOUNTER — Other Ambulatory Visit: Payer: Self-pay

## 2021-10-23 VITALS — BP 143/95 | HR 86 | Temp 97.8°F | Resp 18 | Ht 72.0 in | Wt 207.0 lb

## 2021-10-23 DIAGNOSIS — D472 Monoclonal gammopathy: Secondary | ICD-10-CM

## 2021-10-23 NOTE — Progress Notes (Signed)
?    [facility] ?[phone]   [phone] ? ?OFFICE PROGRESS NOTE ? ?Kathyrn Lass, MD ?[address] ?[place] [address] ? 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  ?  ?PRIOR THERAPY: None ?  ?CURRENT THERAPY: Observation  ? ?INTERVAL HISTORY: ?Mitchell Morris 56 y.o. male returns to the clinic today for follow-up visit.  The patient is feeling fine today with no concerning complaints except for the arthritis and he is currently followed by rheumatology and on treatment with Cosentyx and Rasuvo.  He continues to have some deformities in his fingers and pain in the wrist bilaterally.  He is also down recently after he lost his brother, mother and father within 1 year.  He denied having any chest pain, shortness of breath, cough or hemoptysis.  He denied having any fever or chills.  He has no nausea, vomiting, diarrhea or constipation.  He has no headache or visual changes.  He had repeat myeloma panel performed recently and he is here for evaluation and discussion of his lab results. ? ? ? ?MEDICAL HISTORY: ?Past Medical History:  ?Diagnosis Date  ? Psoriatic arthritis ([place])   ? ? ?ALLERGIES:  has No Known Allergies. ? ?MEDICATIONS:  ?Current Outpatient Medications  ?Medication Sig Dispense Refill  ? Secukinumab, 300 MG Dose, (COSENTYX SENSOREADY, 300 MG,) 150 MG/ML SOAJ Inject 300 mg into the skin. Every other week    ? amLODipine (NORVASC) 5 MG tablet Take 5 mg by mouth daily.    ? folic acid (FOLVITE) 1 MG tablet Take 1 tablet (1 mg total) by mouth daily. 120 tablet 2  ? Multiple Vitamin (MULTI-VITAMIN) tablet Take 1 tablet by mouth daily.    ? RASUVO 15 MG/0.3ML SOAJ INJECT 15 MG UNDER THE SKIN EVERY 7 DAYS 3.6 mL 0  ? VITAMIN D PO Take by mouth daily.    ? ?No current facility-administered medications for this visit.  ? ? ?SURGICAL HISTORY:  ?Past Surgical History:  ?Procedure  Laterality Date  ? COLONOSCOPY  08/2019  ? KNEE ARTHROPLASTY    ? ? ?REVIEW OF SYSTEMS:  A comprehensive review of systems was negative except for: Musculoskeletal: positive for arthralgias and stiff joints  ? ?PHYSICAL EXAMINATION: General appearance: alert, cooperative, and no distress ?Head: Normocephalic, without obvious abnormality, atraumatic ?Neck: no adenopathy, no JVD, supple, symmetrical, trachea midline, and thyroid not enlarged, symmetric, no tenderness/mass/nodules ?Lymph nodes: Cervical, supraclavicular, and axillary nodes normal. ?Resp: clear to auscultation bilaterally ?Back: symmetric, no curvature. ROM normal. No CVA tenderness. ?Cardio: regular rate and rhythm, S1, S2 normal, no murmur, click, rub or gallop ?GI: soft, non-tender; bowel sounds normal; no masses,  no organomegaly ?Extremities: extremities normal, atraumatic, no cyanosis or edema ? ?ECOG PERFORMANCE STATUS: 1 - Symptomatic but completely ambulatory ? ?Blood pressure (!) 143/95, pulse 86, temperature 97.8 ?F (36.6 ?C), temperature source Tympanic, resp. rate 18, height 6' (1.829 m), weight 207 lb (93.9 kg), SpO2 98 %. ? ?LABORATORY DATA: ?Lab Results  ?Component Value Date  ? WBC 6.8 10/16/2021  ? HGB 14.7 10/16/2021  ? HCT 42.6 10/16/2021  ? MCV 85.2 10/16/2021  ? PLT 339 10/16/2021  ? ? ?  Chemistry   ?   ?Component Value Date/Time  ? NA 139 10/16/2021 0906  ? K 4.1 10/16/2021 0906  ? CL 105 10/16/2021 0906  ? CO2 25 10/16/2021 0906  ? BUN 15 10/16/2021 0906  ? CREATININE  0.83 10/16/2021 0906  ? CREATININE 0.97 12/05/2020 0842  ?    ?Component Value Date/Time  ? CALCIUM 9.8 10/16/2021 0906  ? ALKPHOS 68 10/16/2021 0906  ? AST 24 10/16/2021 0906  ? ALT 50 (H) 10/16/2021 0906  ? BILITOT 0.5 10/16/2021 4193  ?  ? ? ? ?RADIOGRAPHIC STUDIES: ?No results found. ? ?ASSESSMENT AND PLAN: This is a very pleasant 56 years old white male with history of monoclonal gammopathy of undetermined significance and currently on observation. ?The  patient is feeling fine today with no concerning complaints. ?Repeat myeloma panel showed no concerning findings for progression. ?He continues to complain of the arthritis and he is followed by rheumatology and currently on treatment with Cosentyx and Rasuvo. ?I discussed the lab results with the patient and recommended for him to continue on observation with repeat myeloma panel in 1 year. ?The patient was advised to call immediately if he has any other concerning symptoms in the interval. ?The patient voices understanding of current disease status and treatment options and is in agreement with the current care plan. ? ?All questions were answered. The patient knows to call the clinic with any problems, questions or concerns. We can certainly see the patient much sooner if necessary. ? ? ?Disclaimer: This note was dictated with voice recognition software. Similar sounding words can inadvertently be transcribed and may not be corrected upon review. ? ? ?  ?  ?

## 2022-01-14 ENCOUNTER — Other Ambulatory Visit: Payer: Self-pay

## 2022-01-14 ENCOUNTER — Emergency Department (HOSPITAL_BASED_OUTPATIENT_CLINIC_OR_DEPARTMENT_OTHER)
Admission: EM | Admit: 2022-01-14 | Discharge: 2022-01-14 | Disposition: A | Discharge status: Home / Self Care | Payer: Managed Care, Other (non HMO) | Attending: Emergency Medicine | Admitting: Emergency Medicine

## 2022-01-14 DIAGNOSIS — R509 Fever, unspecified: Secondary | ICD-10-CM | POA: Diagnosis present

## 2022-01-14 DIAGNOSIS — D72819 Decreased white blood cell count, unspecified: Secondary | ICD-10-CM | POA: Diagnosis not present

## 2022-01-14 LAB — BASIC METABOLIC PANEL
Anion gap: 9 (ref 5–15)
BUN: 16 mg/dL (ref 6–20)
CO2: 26 mmol/L (ref 22–32)
Calcium: 9.4 mg/dL (ref 8.9–10.3)
Chloride: 100 mmol/L (ref 98–111)
Creatinine, Ser: 1.12 mg/dL (ref 0.61–1.24)
GFR, Estimated: >60 mL/min (ref >60)
Glucose, Bld: 123 mg/dL — ABNORMAL HIGH (ref 70–99)
Potassium: 3.6 mmol/L (ref 3.5–5.1)
Sodium: 135 mmol/L (ref 135–145)

## 2022-01-14 LAB — CBC WITH DIFFERENTIAL/PLATELET
Abs Immature Granulocytes: 0.01 10*3/uL (ref 0.00–0.07)
Basophils Absolute: 0 10*3/uL (ref 0.0–0.1)
Basophils Relative: 1 %
Eosinophils Absolute: 0 10*3/uL (ref 0.0–0.5)
Eosinophils Relative: 0 %
HCT: 42.7 % (ref 39.0–52.0)
Hemoglobin: 14.5 g/dL (ref 13.0–17.0)
Immature Granulocytes: 1 %
Lymphocytes Relative: 19 %
Lymphs Abs: 0.4 10*3/uL — ABNORMAL LOW (ref 0.7–4.0)
MCH: 29.4 pg (ref 26.0–34.0)
MCHC: 34 g/dL (ref 30.0–36.0)
MCV: 86.6 fL (ref 80.0–100.0)
Monocytes Absolute: 0.3 10*3/uL (ref 0.1–1.0)
Monocytes Relative: 12 %
Neutro Abs: 1.5 10*3/uL — ABNORMAL LOW (ref 1.7–7.7)
Neutrophils Relative %: 67 %
Platelets: 173 10*3/uL (ref 150–400)
RBC: 4.93 MIL/uL (ref 4.22–5.81)
RDW: 13.3 % (ref 11.5–15.5)
WBC: 2.2 10*3/uL — ABNORMAL LOW (ref 4.0–10.5)
nRBC: 0 % (ref 0.0–0.2)

## 2022-01-14 MED ORDER — SODIUM CHLORIDE 0.9 % IV BOLUS
1000.0000 mL | Freq: Once | INTRAVENOUS | Status: AC
  Administered 2022-01-14: 1000 mL via INTRAVENOUS

## 2022-01-14 NOTE — ED Provider Notes (Signed)
[facility] EMERGENCY DEPT Provider Note   CSN: [id number] Arrival date & time: 01/14/22  4401     History  Chief Complaint  Patient presents with   Dehydration    Mitchell Morris is a 56 y.o. male.  Patient presents ER chief complaint of febrile illness last week, residual nausea vomiting 2 days ago.  Symptoms have improved currently but he was concerned that he may be dehydrated and presents to the ER.  Denies any headache or chest pain or abdominal pain.  Denies any difficulty breathing or cough.  Denies any diarrhea.  Had some dry heaves yesterday but no vomiting.  Able to tolerate oral intake per patient.       Home Medications Prior to Admission medications   Medication Sig Start Date End Date Taking? Authorizing Provider  amLODipine (NORVASC) 5 MG tablet Take 5 mg by mouth daily. 08/11/21   [provider]  folic acid (FOLVITE) 1 MG tablet Take 1 tablet (1 mg total) by mouth daily. 07/01/18   Ofilia Neas, PA-C  Multiple Vitamin (MULTI-VITAMIN) tablet Take 1 tablet by mouth daily.    [provider]  RASUVO 15 MG/0.3ML SOAJ INJECT 15 MG UNDER THE SKIN EVERY 7 DAYS 12/13/20   Deveshwar, Abel Presto, MD  Secukinumab, 300 MG Dose, (COSENTYX SENSOREADY, 300 MG,) 150 MG/ML SOAJ Inject 300 mg into the skin. Every other week 07/12/21   [provider]  VITAMIN D PO Take by mouth daily.    [provider]      Allergies    Patient has no known allergies.    Review of Systems   Review of Systems  HENT:  Negative for ear pain and sore throat.   Eyes:  Negative for pain.  Respiratory:  Negative for cough.   Cardiovascular:  Negative for chest pain.  Gastrointestinal:  Negative for abdominal pain.  Genitourinary:  Negative for flank pain.  Musculoskeletal:  Negative for back pain.  Skin:  Negative for color change and rash.  Neurological:  Negative for syncope.  All other systems reviewed and are negative.   Physical  Exam Updated Vital Signs BP 139/88 (BP Location: Right Arm)   Pulse 87   Temp 98.8 F (37.1 C) (Oral)   Resp 16   Ht 6' (1.829 m)   Wt 92.1 kg   SpO2 100%   BMI 27.53 kg/m  Physical Exam Constitutional:      Appearance: He is well-developed.  HENT:     Head: Normocephalic.     Nose: Nose normal.  Eyes:     Extraocular Movements: Extraocular movements intact.  Cardiovascular:     Rate and Rhythm: Normal rate.  Pulmonary:     Effort: Pulmonary effort is normal.  Abdominal:     Tenderness: There is no abdominal tenderness. There is no guarding or rebound.  Skin:    Coloration: Skin is not jaundiced.  Neurological:     Mental Status: He is alert. Mental status is at baseline.     ED Results / Procedures / Treatments   Labs (all labs ordered are listed, but only abnormal results are displayed) Labs Reviewed  CBC WITH DIFFERENTIAL/PLATELET - Abnormal; Notable for the following components:      Result Value   WBC 2.2 (*)    Neutro Abs 1.5 (*)    Lymphs Abs 0.4 (*)    All other components within normal limits  BASIC METABOLIC PANEL - Abnormal; Notable for the following components:   Glucose,  Bld 123 (*)    All other components within normal limits    EKG None  Radiology No results found.  Procedures Procedures    Medications Ordered in ED Medications  sodium chloride 0.9 % bolus 1,000 mL (1,000 mLs Intravenous New Bag/Given 01/14/22 1012)    ED Course/ Medical Decision Making/ A&P                           Medical Decision Making Amount and/or Complexity of Data Reviewed Labs: ordered.   Review of records shows office visit October 23, 2021 for monoclonal gammopathy.  Cardiac monitoring showing sinus rhythm.  Labs are sent, white count slightly low at 2.2 which is unusual for the patient.  Vital signs otherwise within normal limits and chemistry normal creatinine BUN and normal.  Patient given IV fluid resuscitation.  Recommended outpatient follow-up  with his doctor regarding his low white count.  Advising immediate return if his symptoms worsen or if he cannot keep down any fluids or if he has any additional concerns.        Final Clinical Impression(s) / ED Diagnoses Final diagnoses:  Leukopenia, unspecified type    Rx / DC Orders ED Discharge Orders     None         Luna Fuse, MD 01/14/22 1050

## 2022-01-14 NOTE — Discharge Instructions (Signed)
Call your primary care doctor or specialist as discussed in the next 2-3 days.   Return immediately back to the ER if:  Your symptoms worsen within the next 12-24 hours. You develop new symptoms such as new fevers, persistent vomiting, new pain, shortness of breath, or new weakness or numbness, or if you have any other concerns.  

## 2022-10-19 ENCOUNTER — Other Ambulatory Visit: Payer: Managed Care, Other (non HMO)

## 2022-10-25 ENCOUNTER — Telehealth: Payer: Self-pay | Admitting: Medical Oncology

## 2022-10-25 ENCOUNTER — Other Ambulatory Visit: Payer: Self-pay | Admitting: Medical Oncology

## 2022-10-25 ENCOUNTER — Inpatient Hospital Stay: Payer: Managed Care, Other (non HMO) | Attending: Internal Medicine | Admitting: Internal Medicine

## 2022-10-25 DIAGNOSIS — D472 Monoclonal gammopathy: Secondary | ICD-10-CM

## 2022-10-25 NOTE — Telephone Encounter (Signed)
Schedule message sent to r/s appts for lab and [facility] .

## 2022-10-25 NOTE — Telephone Encounter (Signed)
LVM to return my call to r/s labs and appt. Labs need to be done 1 week before visit.

## 2022-10-31 ENCOUNTER — Other Ambulatory Visit: Payer: Managed Care, Other (non HMO)

## 2022-11-07 ENCOUNTER — Ambulatory Visit: Payer: Managed Care, Other (non HMO) | Admitting: Internal Medicine

## 2022-11-28 ENCOUNTER — Inpatient Hospital Stay: Payer: Managed Care, Other (non HMO) | Attending: Internal Medicine

## 2022-11-28 DIAGNOSIS — D472 Monoclonal gammopathy: Secondary | ICD-10-CM | POA: Insufficient documentation

## 2022-11-28 DIAGNOSIS — N50819 Testicular pain, unspecified: Secondary | ICD-10-CM | POA: Diagnosis not present

## 2022-11-28 DIAGNOSIS — Z79899 Other long term (current) drug therapy: Secondary | ICD-10-CM | POA: Diagnosis not present

## 2022-11-28 DIAGNOSIS — M1990 Unspecified osteoarthritis, unspecified site: Secondary | ICD-10-CM | POA: Diagnosis not present

## 2022-11-28 DIAGNOSIS — Z8043 Family history of malignant neoplasm of testis: Secondary | ICD-10-CM | POA: Diagnosis not present

## 2022-11-28 LAB — CBC WITH DIFFERENTIAL (CANCER CENTER ONLY)
Abs Immature Granulocytes: 0.01 10*3/uL (ref 0.00–0.07)
Basophils Absolute: 0 10*3/uL (ref 0.0–0.1)
Basophils Relative: 0 %
Eosinophils Absolute: 0 10*3/uL (ref 0.0–0.5)
Eosinophils Relative: 1 %
HCT: 40.6 % (ref 39.0–52.0)
Hemoglobin: 13.9 g/dL (ref 13.0–17.0)
Immature Granulocytes: 0 %
Lymphocytes Relative: 34 %
Lymphs Abs: 2 10*3/uL (ref 0.7–4.0)
MCH: 29.1 pg (ref 26.0–34.0)
MCHC: 34.2 g/dL (ref 30.0–36.0)
MCV: 84.9 fL (ref 80.0–100.0)
Monocytes Absolute: 0.5 10*3/uL (ref 0.1–1.0)
Monocytes Relative: 8 %
Neutro Abs: 3.4 10*3/uL (ref 1.7–7.7)
Neutrophils Relative %: 57 %
Platelet Count: 246 10*3/uL (ref 150–400)
RBC: 4.78 MIL/uL (ref 4.22–5.81)
RDW: 12.2 % (ref 11.5–15.5)
WBC Count: 5.8 10*3/uL (ref 4.0–10.5)
nRBC: 0 % (ref 0.0–0.2)

## 2022-11-28 LAB — CMP (CANCER CENTER ONLY)
ALT: 27 U/L (ref 0–44)
AST: 20 U/L (ref 15–41)
Albumin: 4.4 g/dL (ref 3.5–5.0)
Alkaline Phosphatase: 59 U/L (ref 38–126)
Anion gap: 6 (ref 5–15)
BUN: 18 mg/dL (ref 6–20)
CO2: 29 mmol/L (ref 22–32)
Calcium: 9.3 mg/dL (ref 8.9–10.3)
Chloride: 103 mmol/L (ref 98–111)
Creatinine: 0.94 mg/dL (ref 0.61–1.24)
GFR, Estimated: >60 mL/min (ref >60)
Glucose, Bld: 131 mg/dL — ABNORMAL HIGH (ref 70–99)
Potassium: 3.6 mmol/L (ref 3.5–5.1)
Sodium: 138 mmol/L (ref 135–145)
Total Bilirubin: 0.7 mg/dL (ref 0.3–1.2)
Total Protein: 7.4 g/dL (ref 6.5–8.1)

## 2022-11-28 LAB — LACTATE DEHYDROGENASE: LDH: 138 U/L (ref 98–192)

## 2022-11-29 LAB — BETA 2 MICROGLOBULIN, SERUM: Beta-2 Microglobulin: 1.3 mg/L (ref 0.6–2.4)

## 2022-11-29 LAB — KAPPA/LAMBDA LIGHT CHAINS
Kappa free light chain: 14.9 mg/L (ref 3.3–19.4)
Kappa, lambda light chain ratio: 0.04 — ABNORMAL LOW (ref 0.26–1.65)
Lambda free light chains: 421.1 mg/L — ABNORMAL HIGH (ref 5.7–26.3)

## 2022-12-05 ENCOUNTER — Inpatient Hospital Stay: Payer: Managed Care, Other (non HMO) | Admitting: Internal Medicine

## 2022-12-05 VITALS — BP 156/91 | HR 83 | Temp 98.2°F | Resp 17 | Wt 205.0 lb

## 2022-12-05 DIAGNOSIS — D472 Monoclonal gammopathy: Secondary | ICD-10-CM | POA: Diagnosis not present

## 2022-12-05 NOTE — Progress Notes (Signed)
[facility] [phone]   [phone]  OFFICE PROGRESS NOTE  Sigmund Hazel, MD [address] 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    PRIOR THERAPY: None   CURRENT THERAPY: Observation   INTERVAL HISTORY: Mitchell Morris 57 y.o. male returns to the clinic today for annual follow-up visit.  The patient is feeling fine today with no concerning complaints except for intermittent pain in the right lower rib cage and flank area with some pain in the testicle especially in the morning.  He has this happening few times recently.  His brother has a history of testicular cancer and he is concerned about it.  He denied having any current chest pain, shortness of breath, cough or hemoptysis.  He has no nausea, vomiting, diarrhea or constipation.  He has no headache or visual changes.  He denied having any recent weight loss or night sweats.  He is here today for evaluation with repeat myeloma panel.    MEDICAL HISTORY: Past Medical History:  Diagnosis Date   Psoriatic arthritis ([facility])     ALLERGIES:  has No Known Allergies.  MEDICATIONS:  Current Outpatient Medications  Medication Sig Dispense Refill   amLODipine (NORVASC) 5 MG tablet Take 5 mg by mouth daily.     folic acid (FOLVITE) 1 MG tablet Take 1 tablet (1 mg total) by mouth daily. 120 tablet 2   Multiple Vitamin (MULTI-VITAMIN) tablet Take 1 tablet by mouth daily.     RASUVO 15 MG/0.3ML SOAJ INJECT 15 MG UNDER THE SKIN EVERY 7 DAYS 3.6 mL 0   Secukinumab, 300 MG Dose, (COSENTYX SENSOREADY, 300 MG,) 150 MG/ML SOAJ Inject 300 mg into the skin. Every other week     VITAMIN D PO Take by mouth daily.     No current facility-administered medications for this visit.    SURGICAL HISTORY:  Past Surgical History:  Procedure Laterality Date   COLONOSCOPY  08/2019   KNEE ARTHROPLASTY       REVIEW OF SYSTEMS:  A comprehensive review of systems was negative except for: Genitourinary: positive for testicular pain in the morning Musculoskeletal: positive for arthralgias and stiff joints   PHYSICAL EXAMINATION: General appearance: alert, cooperative, and no distress Head: Normocephalic, without obvious abnormality, atraumatic Neck: no adenopathy, no JVD, supple, symmetrical, trachea midline, and thyroid not enlarged, symmetric, no tenderness/mass/nodules Lymph nodes: Cervical, supraclavicular, and axillary nodes normal. Resp: clear to auscultation bilaterally Back: symmetric, no curvature. ROM normal. No CVA tenderness. Cardio: regular rate and rhythm, S1, S2 normal, no murmur, click, rub or gallop GI: soft, non-tender; bowel sounds normal; no masses,  no organomegaly Extremities: extremities normal, atraumatic, no cyanosis or edema  ECOG PERFORMANCE STATUS: 1 - Symptomatic but completely ambulatory  Blood pressure (!) 156/91, pulse 83, temperature 98.2 F (36.8 C), temperature source Oral, resp. rate 17, weight 205 lb (93 kg), SpO2 96 %.  LABORATORY DATA: Lab Results  Component Value Date   WBC 5.8 11/28/2022   HGB 13.9 11/28/2022   HCT 40.6 11/28/2022   MCV 84.9 11/28/2022   PLT 246 11/28/2022      Chemistry      Component Value Date/Time   NA 138 11/28/2022 1318   K 3.6 11/28/2022 1318   CL 103 11/28/2022 1318   CO2 29 11/28/2022 1318   BUN 18 11/28/2022 1318   CREATININE 0.94 11/28/2022 1318  CREATININE 0.97 12/05/2020 0842      Component Value Date/Time   CALCIUM 9.3 11/28/2022 1318   ALKPHOS 59 11/28/2022 1318   AST 20 11/28/2022 1318   ALT 27 11/28/2022 1318   BILITOT 0.7 11/28/2022 1318       RADIOGRAPHIC STUDIES: No results found.  ASSESSMENT AND PLAN: This is a very pleasant 57 years old white male with history of monoclonal gammopathy of undetermined significance and currently on observation. The patient is feeling fine today with no  concerning complaints except for the right flank and testicular pain recently.  Has some concern about it especially with his brother history of testicular cancer. He had repeat myeloma panel that is unremarkable. I recommended for the patient to continue on observation with repeat myeloma panel in 1 year. For the right flank and testicular pain, I recommended for him to check with his primary care physician and he may need imaging studies of the abdomen and pelvis to rule out any underlying condition. He continues to complain of the arthritis and he is followed by rheumatology and currently on treatment with Cosentyx and Rasuvo. The patient was advised to call immediately if he has any concerning symptoms in the interval. The patient voices understanding of current disease status and treatment options and is in agreement with the current care plan.  All questions were answered. The patient knows to call the clinic with any problems, questions or concerns. We can certainly see the patient much sooner if necessary.   Disclaimer: This note was dictated with voice recognition software. Similar sounding words can inadvertently be transcribed and may not be corrected upon review.

## 2023-06-07 ENCOUNTER — Other Ambulatory Visit (HOSPITAL_COMMUNITY): Payer: Self-pay | Admitting: Family Medicine

## 2023-06-07 DIAGNOSIS — E7800 Pure hypercholesterolemia, unspecified: Secondary | ICD-10-CM

## 2023-06-19 ENCOUNTER — Ambulatory Visit (HOSPITAL_COMMUNITY)
Admission: RE | Admit: 2023-06-19 | Discharge: 2023-06-19 | Disposition: A | Discharge status: Home / Self Care | Payer: Managed Care, Other (non HMO) | Source: Ambulatory Visit | Attending: Family Medicine | Admitting: Family Medicine

## 2023-06-19 DIAGNOSIS — E7800 Pure hypercholesterolemia, unspecified: Secondary | ICD-10-CM | POA: Insufficient documentation

## 2023-12-03 ENCOUNTER — Inpatient Hospital Stay: Payer: Managed Care, Other (non HMO) | Attending: Internal Medicine

## 2023-12-03 DIAGNOSIS — I10 Essential (primary) hypertension: Secondary | ICD-10-CM | POA: Diagnosis not present

## 2023-12-03 DIAGNOSIS — L4050 Arthropathic psoriasis, unspecified: Secondary | ICD-10-CM | POA: Insufficient documentation

## 2023-12-03 DIAGNOSIS — D472 Monoclonal gammopathy: Secondary | ICD-10-CM

## 2023-12-03 LAB — CMP (CANCER CENTER ONLY)
ALT: 20 U/L (ref 0–44)
AST: 16 U/L (ref 15–41)
Albumin: 4.3 g/dL (ref 3.5–5.0)
Alkaline Phosphatase: 73 U/L (ref 38–126)
Anion gap: 6 (ref 5–15)
BUN: 20 mg/dL (ref 6–20)
CO2: 27 mmol/L (ref 22–32)
Calcium: 9.1 mg/dL (ref 8.9–10.3)
Chloride: 106 mmol/L (ref 98–111)
Creatinine: 0.94 mg/dL (ref 0.61–1.24)
GFR, Estimated: >60 mL/min (ref >60)
Glucose, Bld: 119 mg/dL — ABNORMAL HIGH (ref 70–99)
Potassium: 3.8 mmol/L (ref 3.5–5.1)
Sodium: 139 mmol/L (ref 135–145)
Total Bilirubin: 0.5 mg/dL (ref 0.0–1.2)
Total Protein: 7.5 g/dL (ref 6.5–8.1)

## 2023-12-03 LAB — CBC WITH DIFFERENTIAL (CANCER CENTER ONLY)
Abs Immature Granulocytes: 0.01 10*3/uL (ref 0.00–0.07)
Basophils Absolute: 0 10*3/uL (ref 0.0–0.1)
Basophils Relative: 1 %
Eosinophils Absolute: 0 10*3/uL (ref 0.0–0.5)
Eosinophils Relative: 0 %
HCT: 41.3 % (ref 39.0–52.0)
Hemoglobin: 13.7 g/dL (ref 13.0–17.0)
Immature Granulocytes: 0 %
Lymphocytes Relative: 44 %
Lymphs Abs: 2.4 10*3/uL (ref 0.7–4.0)
MCH: 28.2 pg (ref 26.0–34.0)
MCHC: 33.2 g/dL (ref 30.0–36.0)
MCV: 85 fL (ref 80.0–100.0)
Monocytes Absolute: 0.5 10*3/uL (ref 0.1–1.0)
Monocytes Relative: 10 %
Neutro Abs: 2.5 10*3/uL (ref 1.7–7.7)
Neutrophils Relative %: 45 %
Platelet Count: 242 10*3/uL (ref 150–400)
RBC: 4.86 MIL/uL (ref 4.22–5.81)
RDW: 12.6 % (ref 11.5–15.5)
WBC Count: 5.5 10*3/uL (ref 4.0–10.5)
nRBC: 0 % (ref 0.0–0.2)

## 2023-12-03 LAB — LACTATE DEHYDROGENASE: LDH: 138 U/L (ref 98–192)

## 2023-12-04 LAB — IGG, IGA, IGM
IgA: 525 mg/dL — ABNORMAL HIGH (ref 90–386)
IgG (Immunoglobin G), Serum: 1290 mg/dL (ref 603–1613)
IgM (Immunoglobulin M), Srm: 162 mg/dL (ref 20–172)

## 2023-12-04 LAB — BETA 2 MICROGLOBULIN, SERUM: Beta-2 Microglobulin: 1.7 mg/L (ref 0.6–2.4)

## 2023-12-05 LAB — KAPPA/LAMBDA LIGHT CHAINS
Kappa free light chain: 15.1 mg/L (ref 3.3–19.4)
Kappa, lambda light chain ratio: 0.03 — ABNORMAL LOW (ref 0.26–1.65)
Lambda free light chains: 467.6 mg/L — ABNORMAL HIGH (ref 5.7–26.3)

## 2023-12-10 ENCOUNTER — Inpatient Hospital Stay (HOSPITAL_BASED_OUTPATIENT_CLINIC_OR_DEPARTMENT_OTHER): Payer: Managed Care, Other (non HMO) | Admitting: Internal Medicine

## 2023-12-10 VITALS — BP 145/88 | HR 80 | Temp 98.7°F | Resp 17 | Ht 72.0 in | Wt 204.0 lb

## 2023-12-10 DIAGNOSIS — D472 Monoclonal gammopathy: Secondary | ICD-10-CM | POA: Diagnosis not present

## 2023-12-10 NOTE — Progress Notes (Signed)
 [facility] [phone]   [phone]  OFFICE PROGRESS NOTE  Perley Bradley, MD [address] 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    PRIOR THERAPY: None   CURRENT THERAPY: Observation   INTERVAL HISTORY: Mitchell Morris 58 y.o. male returns to the clinic today for annual follow-up visit.Discussed the use of AI scribe software for clinical note transcription with the patient, who gave verbal consent to proceed.  History of Present Illness   Mitchell Morris is a 58 year old male with MGUS who presents for routine follow-up.  He has been monitored for monoclonal gammopathy of undetermined significance (MGUS) since 2020. Over the past year, there have been no new complaints or changes. His IgA and free lambda light chain levels remain stable, with no signs of progression to multiple myeloma or other related disorders. Creatinine and blood count are normal, with no anemia or thrombocytopenia.  He experiences significant inflammation due to psoriatic arthritis, primarily affecting his hands, especially the knuckles, while larger joints are less problematic. He is under the care of a rheumatologist and is currently taking Cosentyx  and sulfasalazine, though he is unsure of the sulfasalazine dosage. He stopped taking Cosentyx  after experiencing chills and severe sweating following injections, with similar symptoms occurring about four weeks ago, resulting in four days of bed rest. He takes ibuprofen daily for pain management.  He is working on reducing his red wine consumption, noting that abstaining from alcohol for ten days during a recent illness led to decreased blood pressure and reduced hand pain. He acknowledges that his blood pressure remains high and has not been taking his medication since his illness three weeks ago.          MEDICAL HISTORY: Past Medical History:  Diagnosis Date   Psoriatic arthritis (HCC)     ALLERGIES:  has no known allergies.  MEDICATIONS:  Current Outpatient Medications  Medication Sig Dispense Refill   amLODipine (NORVASC) 5 MG tablet Take 5 mg by mouth daily.     folic acid  (FOLVITE ) 1 MG tablet Take 1 tablet (1 mg total) by mouth daily. 120 tablet 2   Multiple Vitamin (MULTI-VITAMIN) tablet Take 1 tablet by mouth daily.     RASUVO  15 MG/0.3ML SOAJ INJECT 15 MG UNDER THE SKIN EVERY 7 DAYS 3.6 mL 0   Secukinumab , 300 MG Dose, (COSENTYX  SENSOREADY, 300 MG,) 150 MG/ML SOAJ Inject 300 mg into the skin. Every other week     VITAMIN D  PO Take by mouth daily.     No current facility-administered medications for this visit.    SURGICAL HISTORY:  Past Surgical History:  Procedure Laterality Date   COLONOSCOPY  08/2019   KNEE ARTHROPLASTY      REVIEW OF SYSTEMS:  A comprehensive review of systems was negative except for: Constitutional: positive for fatigue Musculoskeletal: positive for arthralgias and stiff joints   PHYSICAL EXAMINATION: General appearance: alert, cooperative, and no distress Head: Normocephalic, without obvious abnormality, atraumatic Neck: no adenopathy, no JVD, supple, symmetrical, trachea midline, and thyroid  not enlarged, symmetric, no tenderness/mass/nodules Lymph nodes: Cervical, supraclavicular, and axillary nodes normal. Resp: clear to auscultation bilaterally Back: symmetric, no curvature. ROM normal. No CVA tenderness. Cardio: regular rate and rhythm, S1, S2 normal, no murmur, click, rub or gallop GI: soft, non-tender; bowel sounds normal; no masses,  no organomegaly Extremities: extremities normal, atraumatic, no cyanosis or  edema  ECOG PERFORMANCE STATUS: 1 - Symptomatic but completely ambulatory  Blood pressure (!) 145/88, pulse 80, temperature 98.7 F (37.1 C), temperature source Temporal, resp. rate 17, height 6' (1.829 m), weight 204 lb (92.5  kg), SpO2 96%.  LABORATORY DATA: Lab Results  Component Value Date   WBC 5.5 12/03/2023   HGB 13.7 12/03/2023   HCT 41.3 12/03/2023   MCV 85.0 12/03/2023   PLT 242 12/03/2023      Chemistry      Component Value Date/Time   NA 139 12/03/2023 1338   K 3.8 12/03/2023 1338   CL 106 12/03/2023 1338   CO2 27 12/03/2023 1338   BUN 20 12/03/2023 1338   CREATININE 0.94 12/03/2023 1338   CREATININE 0.97 12/05/2020 0842      Component Value Date/Time   CALCIUM 9.1 12/03/2023 1338   ALKPHOS 73 12/03/2023 1338   AST 16 12/03/2023 1338   ALT 20 12/03/2023 1338   BILITOT 0.5 12/03/2023 1338       RADIOGRAPHIC STUDIES: No results found.  ASSESSMENT AND PLAN: This is a very pleasant 58 years old white male with history of monoclonal gammopathy of undetermined significance and currently on observation. The patient also has a history of psoriatic arthritis followed by rheumatology. He had repeat myeloma panel performed recently.  I discussed the lab result with the patient and there is no concerning findings for progression.    Monoclonal gammopathy of undetermined significance (MGUS) MGUS diagnosed in 2020 with consistent laboratory findings. No progression to multiple myeloma or related disorders. Creatinine and blood count are normal, with no anemia or thrombocytopenia. Inflammation may contribute to elevated protein levels. - Continue annual monitoring with laboratory tests.  Psoriatic arthritis Psoriatic arthritis affecting the hands with significant joint involvement and inflammation. Current treatment includes Cosentyx  and sulfasalazine. Recent adverse reactions to Cosentyx , including chills and excessive sweating, necessitate consideration of alternative therapies. Discussion with rheumatologist about potential medication changes due to efficacy concerns and cancer risk associated with some treatments. Lifestyle modifications, including reduced red wine consumption, have positively  impacted symptoms and blood pressure. - Discuss potential medication changes with rheumatologist, considering recent adverse reactions to Cosentyx  and cancer risk associated with alternative treatments. - Continue treatment with sulfasalazine. - Encourage lifestyle modifications, including reducing red wine consumption.  Hypertension Hypertension with recent lapse in medication adherence following illness. Blood pressure remains elevated, but improvement noted during alcohol abstinence. Plans to restart antihypertensive medication. - Restart antihypertensive medication as previously prescribed.   He was advised to call immediately if he has any concerning symptoms in the interval. The patient voices understanding of current disease status and treatment options and is in agreement with the current care plan.  All questions were answered. The patient knows to call the clinic with any problems, questions or concerns. We can certainly see the patient much sooner if necessary.   Disclaimer: This note was dictated with voice recognition software. Similar sounding words can inadvertently be transcribed and may not be corrected upon review.

## 2023-12-13 ENCOUNTER — Other Ambulatory Visit: Payer: Self-pay | Admitting: Family Medicine

## 2023-12-13 DIAGNOSIS — R109 Unspecified abdominal pain: Secondary | ICD-10-CM

## 2023-12-16 ENCOUNTER — Ambulatory Visit
Admission: RE | Admit: 2023-12-16 | Discharge: 2023-12-16 | Disposition: A | Discharge status: Home / Self Care | Source: Ambulatory Visit | Attending: Family Medicine | Admitting: Family Medicine

## 2023-12-16 DIAGNOSIS — R109 Unspecified abdominal pain: Secondary | ICD-10-CM

## 2024-05-05 ENCOUNTER — Other Ambulatory Visit: Payer: Self-pay | Admitting: Family Medicine

## 2024-05-05 DIAGNOSIS — I712 Thoracic aortic aneurysm, without rupture, unspecified: Secondary | ICD-10-CM

## 2024-05-18 ENCOUNTER — Ambulatory Visit
Admission: RE | Admit: 2024-05-18 | Discharge: 2024-05-18 | Disposition: A | Source: Ambulatory Visit | Attending: Family Medicine | Admitting: Family Medicine

## 2024-05-18 DIAGNOSIS — I712 Thoracic aortic aneurysm, without rupture, unspecified: Secondary | ICD-10-CM

## 2024-05-18 MED ORDER — GADOPICLENOL 0.5 MMOL/ML IV SOLN
9.0000 mL | Freq: Once | INTRAVENOUS | Status: AC | PRN
  Administered 2024-05-18: 9 mL via INTRAVENOUS

## 2024-12-02 ENCOUNTER — Inpatient Hospital Stay

## 2024-12-09 ENCOUNTER — Ambulatory Visit: Admitting: Internal Medicine

## 2024-12-10 ENCOUNTER — Inpatient Hospital Stay: Admitting: Internal Medicine
